# Patient Record
Sex: Female | Born: 1990
Health system: Southern US, Community
[De-identification: ages and names within clinical notes are randomized; demographics above are authoritative.]

## PROBLEM LIST (undated history)

## (undated) DIAGNOSIS — N912 Amenorrhea, unspecified: Secondary | ICD-10-CM

## (undated) HISTORY — DX: Amenorrhea, unspecified: N91.2

## (undated) HISTORY — PX: WISDOM TOOTH EXTRACTION: SHX21

---

## 2016-02-19 ENCOUNTER — Ambulatory Visit: Payer: Self-pay | Admitting: Physician Assistant

## 2016-03-04 ENCOUNTER — Encounter: Payer: Self-pay | Admitting: Physician Assistant

## 2016-03-04 ENCOUNTER — Ambulatory Visit (INDEPENDENT_AMBULATORY_CARE_PROVIDER_SITE_OTHER): Payer: PRIVATE HEALTH INSURANCE | Admitting: Physician Assistant

## 2016-03-04 VITALS — BP 108/61 | HR 71 | Ht 66.0 in | Wt 218.0 lb

## 2016-03-04 DIAGNOSIS — F900 Attention-deficit hyperactivity disorder, predominantly inattentive type: Secondary | ICD-10-CM | POA: Insufficient documentation

## 2016-03-04 MED ORDER — METHYLPHENIDATE HCL ER (OSM) 27 MG PO TBCR: 27.0000 mg | EXTENDED_RELEASE_TABLET | ORAL | 0 refills | Status: DC

## 2016-03-04 NOTE — Progress Notes (Signed)
   Subjective:    Patient ID: Crystal Bernard, female    DOB: 02/18/1991, 25 y.o.   MRN: [id number]  HPI  Pt is a 25 yo female who presents to the clinic to establish care.   She has no past medical history except for ADHD. She was diagnosed and been on medication since 2nd grade.   She is currently on Adderall IM but only once a day. She has never tried anything other than adderall. She did try XR for a few months but did not like the way it made her feel groggy, moody, no appetitie. IM seems to be out of her system faster and she feels normal at the end of the day. She wants to try something else. She takes adderall 25mg  1-2 tablets up to twice a day.   .. Family History  Problem Relation Age of Onset  . Alcohol abuse Father   . Depression Father   . Hyperlipidemia Father   . Hypertension Father         Review of Systems  All other systems reviewed and are negative.      Objective:   Physical Exam  Constitutional: She is oriented to person, place, and time. She appears well-developed and well-nourished.  HENT:  Head: Normocephalic and atraumatic.  Cardiovascular: Normal rate, regular rhythm and normal heart sounds.   Pulmonary/Chest: Effort normal and breath sounds normal.  Neurological: She is alert and oriented to person, place, and time.  Psychiatric: She has a normal mood and affect. Her behavior is normal.          Assessment & Plan:  Marland Kitchen.Marland Kitchen.Diagnoses and all orders for this visit:  Attention deficit hyperactivity disorder (ADHD), predominantly inattentive type -     methylphenidate 27 MG PO CR tablet; Take 1 tablet (27 mg total) by mouth every morning.   Will try concerta. Call in one month and let me know how doing. May consider increasing at that time.

## 2016-03-11 ENCOUNTER — Ambulatory Visit: Payer: Self-pay | Admitting: Physician Assistant

## 2016-05-03 ENCOUNTER — Encounter: Payer: Self-pay | Admitting: Sports Medicine

## 2016-05-03 ENCOUNTER — Ambulatory Visit (INDEPENDENT_AMBULATORY_CARE_PROVIDER_SITE_OTHER): Payer: 59

## 2016-05-03 ENCOUNTER — Ambulatory Visit (INDEPENDENT_AMBULATORY_CARE_PROVIDER_SITE_OTHER): Payer: 59 | Admitting: Sports Medicine

## 2016-05-03 DIAGNOSIS — W19XXXA Unspecified fall, initial encounter: Secondary | ICD-10-CM

## 2016-05-03 DIAGNOSIS — S8991XA Unspecified injury of right lower leg, initial encounter: Secondary | ICD-10-CM | POA: Insufficient documentation

## 2016-05-03 DIAGNOSIS — S83102A Unspecified subluxation of left knee, initial encounter: Secondary | ICD-10-CM

## 2016-05-03 DIAGNOSIS — S83101A Unspecified subluxation of right knee, initial encounter: Secondary | ICD-10-CM

## 2016-05-03 MED ORDER — MELOXICAM 15 MG PO TABS: ORAL_TABLET | ORAL | 3 refills | Status: DC

## 2016-05-03 NOTE — Progress Notes (Signed)
   Subjective:    I'm seeing this patient as a consultation for:  Tandy GawJade Breeback, PA-C  CC: Right knee injury  HPI: This is a pleasant 26 year old female, 2 days ago she twisted and fell on her knee, it was forced into a valgus type position. She had immediate pain, swelling that she localized along the medial joint line, she is unable to fully extend the knee, pain is moderate, persistent.  Past medical history:  Negative.  See flowsheet/record as well for more information.  Surgical history: Negative.  See flowsheet/record as well for more information.  Family history: Negative.  See flowsheet/record as well for more information.  Social history: Negative.  See flowsheet/record as well for more information.  Allergies, and medications have been entered into the medical record, reviewed, and no changes needed.   Review of Systems: No headache, visual changes, nausea, vomiting, diarrhea, constipation, dizziness, abdominal pain, skin rash, fevers, chills, night sweats, weight loss, swollen lymph nodes, body aches, joint swelling, muscle aches, chest pain, shortness of breath, mood changes, visual or auditory hallucinations.   Objective:   General: Well Developed, well nourished, and in no acute distress.  Neuro/Psych: Alert and oriented x3, extra-ocular muscles intact, able to move all 4 extremities, sensation grossly intact. Skin: Warm and dry, no rashes noted.  Respiratory: Not using accessory muscles, speaking in full sentences, trachea midline.  Cardiovascular: Pulses palpable, no extremity edema. Abdomen: Does not appear distended. Right Knee: Moderate swelling with an effusion, guarding so unable to fully appreciate the anterior cruciate ligament, she does have pain with valgus stress at the medial joint line but the MCL is intact. ROM normal in flexion and extension and lower leg rotation. Ligaments with solid consistent endpoints including ACL, PCL, LCL, MCL. Negative Mcmurray's  and provocative meniscal tests. Non painful patellar compression. Patellar and quadriceps tendons unremarkable. Hamstring and quadriceps strength is normal.  Impression and Recommendations:   This case required medical decision making of moderate complexity.  Right knee injury Rotation and a valgus stress, definite MCL sprain. I am unable to appreciate her anterior cruciate ligament on exam and she does have an effusion. X-rays, meloxicam, MRI. Return to go over MRI results.

## 2016-05-03 NOTE — Assessment & Plan Note (Signed)
Rotation and a valgus stress, definite MCL sprain. I am unable to appreciate her anterior cruciate ligament on exam and she does have an effusion. X-rays, meloxicam, MRI. Return to go over MRI results.

## 2016-05-06 ENCOUNTER — Ambulatory Visit (INDEPENDENT_AMBULATORY_CARE_PROVIDER_SITE_OTHER): Payer: 59

## 2016-05-06 ENCOUNTER — Other Ambulatory Visit: Payer: Self-pay | Admitting: Physician Assistant

## 2016-05-06 DIAGNOSIS — M6751 Plica syndrome, right knee: Secondary | ICD-10-CM | POA: Diagnosis not present

## 2016-05-06 DIAGNOSIS — S8991XA Unspecified injury of right lower leg, initial encounter: Secondary | ICD-10-CM

## 2016-05-09 ENCOUNTER — Ambulatory Visit: Payer: 59 | Admitting: Sports Medicine

## 2016-05-21 ENCOUNTER — Telehealth: Payer: Self-pay

## 2016-05-21 MED ORDER — OSELTAMIVIR PHOSPHATE 75 MG PO CAPS: 75.0000 mg | ORAL_CAPSULE | Freq: Two times a day (BID) | ORAL | 0 refills | Status: DC

## 2016-05-21 NOTE — Telephone Encounter (Signed)
Crystal Bernard complains of watery eyes, headaches, cold chills, fever and her skin hurts. She states she believes she has the flu. Would like tamiflu. Please advise.

## 2016-05-21 NOTE — Telephone Encounter (Signed)
Ok to send tamiflu 75mg  1 po bid for 5 days #10

## 2016-05-21 NOTE — Telephone Encounter (Signed)
Medication sent. Patient is aware.  

## 2016-06-18 ENCOUNTER — Telehealth: Payer: Self-pay | Admitting: Physician Assistant

## 2016-06-18 MED ORDER — OSELTAMIVIR PHOSPHATE 75 MG PO CAPS: 75.0000 mg | ORAL_CAPSULE | Freq: Every day | ORAL | 0 refills | Status: DC

## 2016-06-18 NOTE — Telephone Encounter (Signed)
Pt's boyfriend has flu dx. She would like preventative. Sent to pharmacy.

## 2016-10-07 ENCOUNTER — Ambulatory Visit: Payer: 59 | Admitting: Osteopathic Medicine

## 2016-10-17 ENCOUNTER — Encounter: Payer: Self-pay | Admitting: Osteopathic Medicine

## 2016-10-17 ENCOUNTER — Ambulatory Visit (INDEPENDENT_AMBULATORY_CARE_PROVIDER_SITE_OTHER): Payer: 59 | Admitting: Osteopathic Medicine

## 2016-10-17 ENCOUNTER — Telehealth: Payer: Self-pay | Admitting: Osteopathic Medicine

## 2016-10-17 VITALS — BP 111/60 | HR 79 | Ht 65.0 in | Wt 216.4 lb

## 2016-10-17 DIAGNOSIS — Z30015 Encounter for initial prescription of vaginal ring hormonal contraceptive: Secondary | ICD-10-CM

## 2016-10-17 MED ORDER — ETONOGESTREL-ETHINYL ESTRADIOL 0.12-0.015 MG/24HR VA RING: VAGINAL_RING | VAGINAL | 4 refills | Status: DC

## 2016-10-17 NOTE — Telephone Encounter (Signed)
Pt informed. Pt had understanding and is agreeable. Pt just wants to discuss birth control options today.

## 2016-10-17 NOTE — Telephone Encounter (Signed)
Noted. Thanks.

## 2016-10-17 NOTE — Telephone Encounter (Signed)
Patient is on my schedule today for IUD insertion. I have never seen her before. Please call patient:  Does she know what kind of IUD she wants? (We need to make sure that we have the desired one in the office - or if patient isn't sure which one she wants, we can discuss it at the visit today).   It is also standard practice to confirm a negative gonorrhea and chlamydia test prior to IUD insertion. I have no record that she has been recently tested, but we can do that today at her visit. Today's appointment can serve as the consultation for IUD insertion but I do not know if we will be able to insert one today since we will not have confirmed negative STD testing or the desired IUD in stock. Please call and make sure the patient is aware of the above.

## 2016-10-17 NOTE — Patient Instructions (Signed)
Ethinyl Estradiol; Etonogestrel vaginal ring What is this medicine? ETHINYL ESTRADIOL; ETONOGESTREL (ETH in il es tra DYE ole; et oh noe JES trel) vaginal ring is a flexible, vaginal ring used as a contraceptive (birth control method). This medicine combines two types of female hormones, an estrogen and a progestin. This ring is used to prevent ovulation and pregnancy. Each ring is effective for one month. This medicine may be used for other purposes; ask your health care provider or pharmacist if you have questions. COMMON BRAND NAME(S): NuvaRing What should I tell my health care provider before I take this medicine? They need to know if you have or ever had any of these conditions: -abnormal vaginal bleeding -blood vessel disease or blood clots -breast, cervical, endometrial, ovarian, liver, or uterine cancer -diabetes -gallbladder disease -heart disease or recent heart attack -high blood pressure -high cholesterol -kidney disease -liver disease -migraine headaches -stroke -systemic lupus erythematosus (SLE) -tobacco smoker -an unusual or allergic reaction to estrogens, progestins, other medicines, foods, dyes, or preservatives -pregnant or trying to get pregnant -breast-feeding How should I use this medicine? Insert the ring into your vagina as directed. Follow the directions on the prescription label. The ring will remain place for 3 weeks and is then removed for a 1-week break. A new ring is inserted 1 week after the last ring was removed, on the same day of the week. Check often to make sure the ring is still in place, especially before and after sexual intercourse. If the ring was out of the vagina for an unknown amount of time, you may not be protected from pregnancy. Perform a pregnancy test and call your doctor. Do not use more often than directed. A patient package insert for the product will be given with each prescription and refill. Read this sheet carefully each time. The  sheet may change frequently. Contact your pediatrician regarding the use of this medicine in children. Special care may be needed. This medicine has been used in female children who have started having menstrual periods. Overdosage: If you think you have taken too much of this medicine contact a poison control center or emergency room at once. NOTE: This medicine is only for you. Do not share this medicine with others. What if I miss a dose? You will need to replace your vaginal ring once a month as directed. If the ring should slip out, or if you leave it in longer or shorter than you should, contact your health care professional for advice. What may interact with this medicine? Do not take this medicine with the following medication: -dasabuvir; ombitasvir; paritaprevir; ritonavir -ombitasvir; paritaprevir; ritonavir This medicine may also interact with the following medications: -acetaminophen -antibiotics or medicines for infections, especially rifampin, rifabutin, rifapentine, and griseofulvin, and possibly penicillins or tetracyclines -aprepitant -ascorbic acid (vitamin C) -atorvastatin -barbiturate medicines, such as phenobarbital -bosentan -carbamazepine -caffeine -clofibrate -cyclosporine -dantrolene -doxercalciferol -felbamate -grapefruit juice -hydrocortisone -medicines for anxiety or sleeping problems, such as diazepam or temazepam -medicines for diabetes, including pioglitazone -modafinil -mycophenolate -nefazodone -oxcarbazepine -phenytoin -prednisolone -ritonavir or other medicines for HIV infection or AIDS -rosuvastatin -selegiline -soy isoflavones supplements -St. John's wort -tamoxifen or raloxifene -theophylline -thyroid hormones -topiramate -warfarin This list may not describe all possible interactions. Give your health care provider a list of all the medicines, herbs, non-prescription drugs, or dietary supplements you use. Also tell them if you smoke,  drink alcohol, or use illegal drugs. Some items may interact with your medicine. What should I watch for while using   this medicine? Visit your doctor or health care professional for regular checks on your progress. You will need a regular breast and pelvic exam and Pap smear while on this medicine. Use an additional method of contraception during the first cycle that you use this ring. Do not use a diaphragm or female condom, as the ring can interfere with these birth control methods and their proper placement. If you have any reason to think you are pregnant, stop using this medicine right away and contact your doctor or health care professional. If you are using this medicine for hormone related problems, it may take several cycles of use to see improvement in your condition. Smoking increases the risk of getting a blood clot or having a stroke while you are using hormonal birth control, especially if you are more than 24 years old. You are strongly advised not to smoke. This medicine can make your body retain fluid, making your fingers, hands, or ankles swell. Your blood pressure can go up. Contact your doctor or health care professional if you feel you are retaining fluid. This medicine can make you more sensitive to the sun. Keep out of the sun. If you cannot avoid being in the sun, wear protective clothing and use sunscreen. Do not use sun lamps or tanning beds/booths. If you wear contact lenses and notice visual changes, or if the lenses begin to feel uncomfortable, consult your eye care specialist. In some women, tenderness, swelling, or minor bleeding of the gums may occur. Notify your dentist if this happens. Brushing and flossing your teeth regularly may help limit this. See your dentist regularly and inform your dentist of the medicines you are taking. If you are going to have elective surgery, you may need to stop using this medicine before the surgery. Consult your health care professional  for advice. This medicine does not protect you against HIV infection (AIDS) or any other sexually transmitted diseases. What side effects may I notice from receiving this medicine? Side effects that you should report to your doctor or health care professional as soon as possible: -breast tissue changes or discharge -changes in vaginal bleeding during your period or between your periods -chest pain -coughing up blood -dizziness or fainting spells -headaches or migraines -leg, arm or groin pain -severe or sudden headaches -stomach pain (severe) -sudden shortness of breath -sudden loss of coordination, especially on one side of the body -speech problems -symptoms of vaginal infection like itching, irritation or unusual discharge -tenderness in the upper abdomen -vomiting -weakness or numbness in the arms or legs, especially on one side of the body -yellowing of the eyes or skin Side effects that usually do not require medical attention (report to your doctor or health care professional if they continue or are bothersome): -breakthrough bleeding and spotting that continues beyond the 3 initial cycles of pills -breast tenderness -mood changes, anxiety, depression, frustration, anger, or emotional outbursts -increased sensitivity to sun or ultraviolet light -nausea -skin rash, acne, or brown spots on the skin -weight gain (slight) This list may not describe all possible side effects. Call your doctor for medical advice about side effects. You may report side effects to FDA at 1-800-FDA-1088. Where should I keep my medicine? Keep out of the reach of children. Store at room temperature between 15 and 30 degrees C (59 and 86 degrees F) for up to 4 months. The product will expire after 4 months. Protect from light. Throw away any unused medicine after the expiration date. NOTE: This   sheet is a summary. It may not cover all possible information. If you have questions about this medicine, talk  to your doctor, pharmacist, or health care provider.  2018 Elsevier/Gold Standard (2015-12-08 17:00:31)  

## 2016-10-18 NOTE — Progress Notes (Signed)
HPI: Crystal Bernard is a 26 y.o. female  who [facility], 10/18/16,  for chief complaint of:  Chief Complaint  Patient presents with  . Contraception    Patient is a virgin, will be getting married in the fall and would like to discuss contraception options. She has some thoughts about IUD, would like to know more about choices here. Has been on birth control pill in the past but these caused weight gain issues. Questionable history of PCOS, patient states ultrasound was normal, menstrual cycle is regular.  Patient is open to long-acting reversible contraception but has some concerns about IUD insertion. Would like to avoid the upper shot or nexplanon due to some concerns for weight gain issues.  Patient is accompanied by mom who assists with history-taking.   Past medical history, surgical history, social history and family history reviewed.  Patient Active Problem List   Diagnosis Date Noted  . Right knee injury 05/03/2016  . Attention deficit hyperactivity disorder (ADHD), predominantly inattentive type 03/04/2016    Current medication list and allergy/intolerance information reviewed.   No current outpatient prescriptions on file prior to visit.   No current facility-administered medications on file prior to visit.    Allergies  Allergen Reactions  . Eggs Or Egg-Derived Products Other (See Comments)    Unknown reaction      Review of Systems:  Constitutional: No recent illness, feels well today   HEENT: No  headache, no hx migraine  Cardiac: No  chest pain, No palpitations  Neurologic: No  weakness, No  Dizziness  Psychiatric: No  concerns with depression, No  concerns with anxiety  Exam:  BP 111/60   Pulse 79   Ht 5\' 5"  (1.651 m)   Wt 216 lb 6.4 oz (98.2 kg)   SpO2 99%   BMI 36.01 kg/m   Constitutional: VS see above. General Appearance: alert, well-developed, well-nourished, NAD  Psychiatric:  Normal judgment/insight. Normal mood and affect. Oriented x3.      ASSESSMENT/PLAN: After discussion of options, has decided to try NuvaRing since this is a good middle of the road option between daily pill versus implantable device/long-acting reversible contraception. Side effects and ideal use of medication were reviewed with the patient. Advised can leave in place maximal 4 weeks.  Encounter for initial prescription of vaginal ring hormonal contraceptive   Follow-up plan: Return for annual exam / pap when due, sooner if needed.  Visit summary with medication list and pertinent instructions was printed for patient to review, alert us if any changes needed. All questions at time of visit were answered - patient instructed to contact office with any additional concerns. ER/RTC precautions were reviewed with the patient and understanding verbalized.   Note: Total time spent 25 minutes, greater than 50% of the visit was spent face-to-face counseling and coordinating care for the following: The encounter diagnosis was Encounter for initial prescription of vaginal ring hormonal contraceptive..Marland Kitchen

## 2016-12-11 ENCOUNTER — Telehealth: Payer: Self-pay | Admitting: Physician Assistant

## 2016-12-11 MED ORDER — SCOPOLAMINE 1 MG/3DAYS TD PT72: 1.0000 | MEDICATED_PATCH | TRANSDERMAL | 0 refills | Status: DC

## 2016-12-11 NOTE — Telephone Encounter (Signed)
Pt is going on her honeymoon and need motion sickness patches.

## 2017-03-19 ENCOUNTER — Ambulatory Visit: Payer: 59 | Admitting: Physician Assistant

## 2017-03-19 ENCOUNTER — Encounter: Payer: Self-pay | Admitting: Physician Assistant

## 2017-03-19 VITALS — BP 144/65 | HR 95 | Temp 98.8°F | Ht 65.0 in | Wt 221.0 lb

## 2017-03-19 DIAGNOSIS — E6609 Other obesity due to excess calories: Secondary | ICD-10-CM | POA: Diagnosis not present

## 2017-03-19 DIAGNOSIS — R52 Pain, unspecified: Secondary | ICD-10-CM

## 2017-03-19 DIAGNOSIS — J069 Acute upper respiratory infection, unspecified: Secondary | ICD-10-CM | POA: Diagnosis not present

## 2017-03-19 DIAGNOSIS — Z6836 Body mass index (BMI) 36.0-36.9, adult: Secondary | ICD-10-CM

## 2017-03-19 DIAGNOSIS — J029 Acute pharyngitis, unspecified: Secondary | ICD-10-CM

## 2017-03-19 DIAGNOSIS — J0120 Acute ethmoidal sinusitis, unspecified: Secondary | ICD-10-CM | POA: Diagnosis not present

## 2017-03-19 DIAGNOSIS — R635 Abnormal weight gain: Secondary | ICD-10-CM | POA: Diagnosis not present

## 2017-03-19 LAB — POCT INFLUENZA A/B
INFLUENZA A, POC: NEGATIVE
INFLUENZA B, POC: NEGATIVE

## 2017-03-19 LAB — POCT RAPID STREP A (OFFICE): Rapid Strep A Screen: NEGATIVE

## 2017-03-19 MED ORDER — AMOXICILLIN-POT CLAVULANATE 875-125 MG PO TABS: 1.0000 | ORAL_TABLET | Freq: Two times a day (BID) | ORAL | 0 refills | Status: DC

## 2017-03-19 MED ORDER — NORETHIN ACE-ETH ESTRAD-FE 1-20 MG-MCG PO TABS: 1.0000 | ORAL_TABLET | Freq: Every day | ORAL | 11 refills | Status: DC

## 2017-03-19 NOTE — Patient Instructions (Signed)
Upper Respiratory Infection, Adult Most upper respiratory infections (URIs) are caused by a virus. A URI affects the nose, throat, and upper air passages. The most common type of URI is often called "the common cold." Follow these instructions at home:  Take medicines only as told by your doctor.  Gargle warm saltwater or take cough drops to comfort your throat as told by your doctor.  Use a warm mist humidifier or inhale steam from a shower to increase air moisture. This may make it easier to breathe.  Drink enough fluid to keep your pee (urine) clear or pale yellow.  Eat soups and other clear broths.  Have a healthy diet.  Rest as needed.  Go back to work when your fever is gone or your doctor says it is okay. ? You may need to stay home longer to avoid giving your URI to others. ? You can also wear a face mask and wash your hands often to prevent spread of the virus.  Use your inhaler more if you have asthma.  Do not use any tobacco products, including cigarettes, chewing tobacco, or electronic cigarettes. If you need help quitting, ask your doctor. Contact a doctor if:  You are getting worse, not better.  Your symptoms are not helped by medicine.  You have chills.  You are getting more short of breath.  You have brown or red mucus.  You have yellow or brown discharge from your nose.  You have pain in your face, especially when you bend forward.  You have a fever.  You have puffy (swollen) neck glands.  You have pain while swallowing.  You have white areas in the back of your throat. Get help right away if:  You have very bad or constant: ? Headache. ? Ear pain. ? Pain in your forehead, behind your eyes, and over your cheekbones (sinus pain). ? Chest pain.  You have long-lasting (chronic) lung disease and any of the following: ? Wheezing. ? Long-lasting cough. ? Coughing up blood. ? A change in your usual mucus.  You have a stiff neck.  You have  changes in your: ? Vision. ? Hearing. ? Thinking. ? Mood. This information is not intended to replace advice given to you by your health care provider. Make sure you discuss any questions you have with your health care provider. Document Released: 09/18/2007 Document Revised: 12/03/2015 Document Reviewed: 07/07/2013 Elsevier Interactive Patient Education  2018 Elsevier Inc.  

## 2017-03-20 ENCOUNTER — Encounter: Payer: Self-pay | Admitting: Physician Assistant

## 2017-03-20 NOTE — Progress Notes (Signed)
Subjective:    Patient ID: Crystal Bernard, female    DOB: 12/21/1990, 26 y.o.   MRN: [id number]  HPI Patient is a 26 year old female who presents to the clinic with sore throat, body aches, chills for the last 3 days.  She is concerned because she has had multiple flu and strep exposures.  She is concerned for strep throat because she always gets a specific smell when she has strep throat.  She has that smell now.  She has not checked her temperature but she reports feeling hot.  She denies any vomiting, nausea or diarrhea.  She does have an ongoing headache.  She has not tried any medications over-the-counter.  Patient also mentions that she feels like her birth control is making her gain weight.  She feels like it is harder to keep the weight off.  She would like to switch birth controls.  She admits she is not exercising or dieting.  .. Active Ambulatory Problems    Diagnosis Date Noted  . Attention deficit hyperactivity disorder (ADHD), predominantly inattentive type 03/04/2016  . Right knee injury 05/03/2016   Resolved Ambulatory Problems    Diagnosis Date Noted  . No Resolved Ambulatory Problems   No Additional Past Medical History      Review of Systems  All other systems reviewed and are negative.      Objective:   Physical Exam  Constitutional: She is oriented to person, place, and time. She appears well-developed and well-nourished.  Pale complexion.   HENT:  Head: Normocephalic and atraumatic.  Right Ear: External ear normal.  Left Ear: External ear normal.  Nose: Nose normal.  TM's clear.  Negative for sinus tenderness to palpation.  Oropharynx erythematous without tonsil swelling or exudate.   Eyes: Conjunctivae are normal. Right eye exhibits no discharge. Left eye exhibits no discharge.  Neck: Normal range of motion. Neck supple.  Cardiovascular: Normal rate, regular rhythm and normal heart sounds.  Pulmonary/Chest: Effort normal and breath sounds  normal. She has no wheezes.  Lymphadenopathy:    She has no cervical adenopathy.  Neurological: She is alert and oriented to person, place, and time.  Skin: Skin is warm.  Psychiatric: She has a normal mood and affect. Her behavior is normal.          Assessment & Plan:  Marland Kitchen.Marland Kitchen.[name] was seen today for cough, headache and sore throat.  Diagnoses and all orders for this visit:  Acute upper respiratory infection -     amoxicillin-clavulanate (AUGMENTIN) 875-125 MG tablet; Take 1 tablet by mouth 2 (two) times daily.  Abnormal weight gain -     norethindrone-ethinyl estradiol (JUNEL FE 1/20) 1-20 MG-MCG tablet; Take 1 tablet by mouth daily. -     TSH  Body aches -     POCT Influenza A/B  Sore throat -     POCT rapid strep A -     POCT Influenza A/B  Acute non-recurrent ethmoidal sinusitis -     amoxicillin-clavulanate (AUGMENTIN) 875-125 MG tablet; Take 1 tablet by mouth 2 (two) times daily.   .. Results for orders placed or performed in visit on 03/19/17  POCT rapid strep A  Result Value Ref Range   Rapid Strep A Screen Negative Negative  POCT Influenza A/B  Result Value Ref Range   Influenza A, POC Negative Negative   Influenza B, POC Negative Negative   Flu and strep were both negative.  I suspect this is a viral infection.  I discussed extensively  symptomatic control of symptoms with Mucinex, decongestants, nasal sprays, rest and hydration.  She has started to have a headache with some sinusitis symptoms.  Antibiotic printed if she has symptoms for longer than a week she could start antibiotic.  Follow-up as needed.  Discussed weight with patient.  Encouraged counting calories with goal being 1200-1500 cal a day.  Increasing protein and decreasing carbohydrates.  Certainly we can try birth control switch to see if this would help with symptoms.  Stop NuvaRing start she now.  I did choose a low estrogen component.  Follow-up in 3 months to see if this is helping.  We  could certainly talk about weight loss medicines as well.  Encouraged at least 150 minutes of exercise a week.

## 2017-03-21 DIAGNOSIS — E6609 Other obesity due to excess calories: Secondary | ICD-10-CM | POA: Insufficient documentation

## 2017-03-21 DIAGNOSIS — R635 Abnormal weight gain: Secondary | ICD-10-CM | POA: Insufficient documentation

## 2017-09-12 ENCOUNTER — Encounter: Payer: Self-pay | Admitting: Physician Assistant

## 2017-09-12 ENCOUNTER — Ambulatory Visit: Payer: 59 | Admitting: Physician Assistant

## 2017-09-12 VITALS — BP 110/65 | HR 79 | Ht 65.0 in | Wt 219.0 lb

## 2017-09-12 DIAGNOSIS — N649 Disorder of breast, unspecified: Secondary | ICD-10-CM | POA: Diagnosis not present

## 2017-09-12 DIAGNOSIS — Z3009 Encounter for other general counseling and advice on contraception: Secondary | ICD-10-CM | POA: Diagnosis not present

## 2017-09-12 DIAGNOSIS — L988 Other specified disorders of the skin and subcutaneous tissue: Secondary | ICD-10-CM

## 2017-09-12 DIAGNOSIS — F900 Attention-deficit hyperactivity disorder, predominantly inattentive type: Secondary | ICD-10-CM

## 2017-09-12 MED ORDER — AMPHETAMINE-DEXTROAMPHET ER 20 MG PO CP24
20.0000 mg | ORAL_CAPSULE | ORAL | 0 refills | Status: DC
Start: 2017-09-12 — End: 2017-11-19

## 2017-09-12 MED ORDER — NORETHIN-ETH ESTRAD-FE BIPHAS 1 MG-10 MCG / 10 MCG PO TABS: 1.0000 | ORAL_TABLET | Freq: Every day | ORAL | 11 refills | Status: DC

## 2017-09-12 NOTE — Progress Notes (Signed)
   Subjective:    Patient ID: Crystal Bernard, female    DOB: 1991/03/29, 27 y.o.   MRN: [id number]  HPI   Pt is a pleasant 27 yo female who presents to the clinic for a few concerns.   She was on concerta  and did not do the trick. She would like to try adderall XR again. She started a new job and felt like that did better in the past.   She also wants a switch of OCP.   She c/o red spot on left breat. Not tried anything to make better. No pain or itching. It does seem to be getting better. No fam hx of BC. No injury or trauma.   .. Active Ambulatory Problems    Diagnosis Date Noted  . Attention deficit hyperactivity disorder (ADHD), predominantly inattentive type 03/04/2016  . Right knee injury 05/03/2016  . Class 2 obesity due to excess calories without serious comorbidity with body mass index (BMI) of 36.0 to 36.9 in adult 03/21/2017  . Abnormal weight gain 03/21/2017  . Skin lesion of breast 09/14/2017   Resolved Ambulatory Problems    Diagnosis Date Noted  . No Resolved Ambulatory Problems   No Additional Past Medical History     Review of Systems  All other systems reviewed and are negative.      Objective:   Physical Exam  Constitutional: She appears well-developed and well-nourished.  HENT:  Head: Normocephalic and atraumatic.  Cardiovascular: Normal rate and regular rhythm.  Pulmonary/Chest: Effort normal and breath sounds normal.    Psychiatric: She has a normal mood and affect. Her behavior is normal.          Assessment & Plan:  Marland KitchenMarland KitchenDiagnoses and all orders for this visit:  Birth control counseling -     Norethindrone-Ethinyl Estradiol-Fe Biphas (LO LOESTRIN FE) 1 MG-10 MCG / 10 MCG tablet; Take 1 tablet by mouth daily.  Skin lesion of breast  Attention deficit hyperactivity disorder (ADHD), predominantly inattentive type -     amphetamine-dextroamphetamine (ADDERALL XR) 20 MG 24 hr capsule; Take 1 capsule (20 mg total) by mouth every  morning.  will switch to lower estrogen dose. Discussed coupon card. Let me know in 2 cycles if you like it.   Adderall given to try for next month. Let me know how the switch is.   Lesion on left breast. Appears to bug bite. Hydrocortisone cool compresses and good moisturizer. Follow up if not improving or resolving.

## 2017-09-14 ENCOUNTER — Encounter: Payer: Self-pay | Admitting: Physician Assistant

## 2017-09-14 DIAGNOSIS — N649 Disorder of breast, unspecified: Secondary | ICD-10-CM | POA: Insufficient documentation

## 2017-09-14 DIAGNOSIS — L988 Other specified disorders of the skin and subcutaneous tissue: Secondary | ICD-10-CM | POA: Insufficient documentation

## 2017-10-05 ENCOUNTER — Telehealth: Payer: Self-pay | Admitting: Physician Assistant

## 2017-10-05 DIAGNOSIS — N926 Irregular menstruation, unspecified: Secondary | ICD-10-CM

## 2017-10-05 NOTE — Telephone Encounter (Signed)
Pt called office with missed period for 1 month. Negative home test. She does have nipple soreness, fatigue and wonders if she could be pregnant.

## 2017-10-06 ENCOUNTER — Ambulatory Visit: Payer: 59 | Admitting: Physician Assistant

## 2017-10-06 LAB — BETA HCG QUANT (REF LAB): hCG Quant: <1 m[IU]/mL

## 2017-11-10 ENCOUNTER — Encounter: Payer: 59 | Admitting: Physician Assistant

## 2017-11-10 ENCOUNTER — Telehealth: Payer: Self-pay | Admitting: *Deleted

## 2017-11-10 ENCOUNTER — Other Ambulatory Visit: Payer: Self-pay | Admitting: Physician Assistant

## 2017-11-10 DIAGNOSIS — Z0000 Encounter for general adult medical examination without abnormal findings: Secondary | ICD-10-CM

## 2017-11-10 DIAGNOSIS — Z131 Encounter for screening for diabetes mellitus: Secondary | ICD-10-CM

## 2017-11-10 DIAGNOSIS — N911 Secondary amenorrhea: Secondary | ICD-10-CM

## 2017-11-10 DIAGNOSIS — Z13220 Encounter for screening for lipoid disorders: Secondary | ICD-10-CM

## 2017-11-10 NOTE — Telephone Encounter (Signed)
Labs reordered all on one lab slip.

## 2017-11-12 ENCOUNTER — Ambulatory Visit (INDEPENDENT_AMBULATORY_CARE_PROVIDER_SITE_OTHER): Payer: Managed Care, Other (non HMO)

## 2017-11-12 ENCOUNTER — Ambulatory Visit (INDEPENDENT_AMBULATORY_CARE_PROVIDER_SITE_OTHER): Payer: Managed Care, Other (non HMO) | Admitting: Physician Assistant

## 2017-11-12 ENCOUNTER — Encounter: Payer: Self-pay | Admitting: Physician Assistant

## 2017-11-12 VITALS — BP 128/69 | HR 98 | Ht 65.0 in | Wt 219.0 lb

## 2017-11-12 DIAGNOSIS — F900 Attention-deficit hyperactivity disorder, predominantly inattentive type: Secondary | ICD-10-CM | POA: Diagnosis not present

## 2017-11-12 DIAGNOSIS — N911 Secondary amenorrhea: Secondary | ICD-10-CM

## 2017-11-12 DIAGNOSIS — R7989 Other specified abnormal findings of blood chemistry: Secondary | ICD-10-CM

## 2017-11-12 DIAGNOSIS — N912 Amenorrhea, unspecified: Secondary | ICD-10-CM

## 2017-11-12 DIAGNOSIS — Z01419 Encounter for gynecological examination (general) (routine) without abnormal findings: Secondary | ICD-10-CM

## 2017-11-12 NOTE — Progress Notes (Signed)
Subjective:     Crystal Bernard is a 27 y.o. female and is here for a comprehensive physical exam. The patient reports problems - pt stopped OCP in june and has not had a period since. she is wanting pregnancy in the near future. she has always thought she could not have children and now because she wants to have children she is worried she cannot. a long time ago she was told she had a endometral cyst. she has painfulf periods. .  Needs ADHD follow up and refill. Doing well. No increase in anxiety, palpitations, vision changes.   Social History   Socioeconomic History  . Marital status: Single    Spouse name: Not on file  . Number of children: Not on file  . Years of education: Not on file  . Highest education level: Not on file  Occupational History  . Not on file  Social Needs  . Financial resource strain: Not on file  . Food insecurity:    Worry: Not on file    Inability: Not on file  . Transportation needs:    Medical: Not on file    Non-medical: Not on file  Tobacco Use  . Smoking status: Never Smoker  . Smokeless tobacco: Never Used  Substance and Sexual Activity  . Alcohol use: Yes  . Drug use: No  . Sexual activity: Never  Lifestyle  . Physical activity:    Days per week: Not on file    Minutes per session: Not on file  . Stress: Not on file  Relationships  . Social connections:    Talks on phone: Not on file    Gets together: Not on file    Attends religious service: Not on file    Active member of club or organization: Not on file    Attends meetings of clubs or organizations: Not on file    Relationship status: Not on file  . Intimate partner violence:    Fear of current or ex partner: Not on file    Emotionally abused: Not on file    Physically abused: Not on file    Forced sexual activity: Not on file  Other Topics Concern  . Not on file  Social History Narrative  . Not on file   Health Maintenance  Topic Date Due  . HIV Screening  01/17/2008   . PAP SMEAR  01/16/2014  . TETANUS/TDAP  09/13/2018 (Originally 01/17/2012)  . INFLUENZA VACCINE  11/13/2017    The following portions of the patient's history were reviewed and updated as appropriate: allergies, current medications, past family history, past medical history, past social history, past surgical history and problem list.  Review of Systems Pertinent items noted in HPI and remainder of comprehensive ROS otherwise negative.   Objective:    BP 128/69   Pulse 98   Ht 5\' 5"  (1.651 m)   Wt 219 lb (99.3 kg)   LMP 08/10/2017   BMI 36.44 kg/m  General appearance: alert, cooperative, appears stated age and moderately obese Head: Normocephalic, without obvious abnormality, atraumatic Eyes: conjunctivae/corneas clear. PERRL, EOM's intact. Fundi benign. Ears: normal TM's and external ear canals both ears Nose: Nares normal. Septum midline. Mucosa normal. No drainage or sinus tenderness. Throat: lips, mucosa, and tongue normal; teeth and gums normal Neck: no adenopathy, no carotid bruit, no JVD, supple, symmetrical, trachea midline and thyroid not enlarged, symmetric, no tenderness/mass/nodules Back: symmetric, no curvature. ROM normal. No CVA tenderness. Lungs: clear to auscultation bilaterally Breasts: normal appearance,  no masses or tenderness Heart: regular rate and rhythm, S1, S2 normal, no murmur, click, rub or gallop Abdomen: soft, non-tender; bowel sounds normal; no masses,  no organomegaly Extremities: extremities normal, atraumatic, no cyanosis or edema Pulses: 2+ and symmetric Skin: Skin color, texture, turgor normal. No rashes or lesions Lymph nodes: Cervical, supraclavicular, and axillary nodes normal. Neurologic: Alert and oriented X 3, normal strength and tone. Normal symmetric reflexes. Normal coordination and gait    Assessment:    Healthy female exam.      Plan:    Marland KitchenMarland KitchenShelby-Anne was seen today for annual exam and gynecologic exam.  Diagnoses and all  orders for this visit:  Encounter for well woman exam with routine gynecological exam -     Cytology - PAP  Secondary amenorrhea -     US PELVIS (TRANSABDOMINAL ONLY) -     US PELVIS TRANSVANGINAL NON-OB (TV ONLY) -     Ambulatory referral to Obstetrics / Gynecology  Low serum follicle stimulating hormone (FSH) -     US PELVIS (TRANSABDOMINAL ONLY) -     US PELVIS TRANSVANGINAL NON-OB (TV ONLY)  Attention deficit hyperactivity disorder (ADHD), predominantly inattentive type -     amphetamine-dextroamphetamine (ADDERALL XR) 20 MG 24 hr capsule; Take 1 capsule (20 mg total) by mouth every morning.   .. Depression screen Crystal Bernard 2/9 09/12/2017  Decreased Interest 0  Down, Depressed, Hopeless 0  PHQ - 2 Score 0   .. Discussed 150 minutes of exercise a week.  Encouraged vitamin D 1000 units and Calcium 1300mg  or 4 servings of dairy a day.  Concern for PCOS. Labs ordered. FSH low everything else looks great. Discussed hormones still could be regulating since coming off OCP. Encouraged to give some more time.  Ordered US pelvis/transvaginal.  Needs GYN made referral. Declined pap today. (not prepared) Declined HPV vaccine today.  adderall refilled. Follow up in 3 months.    See After Visit Summary for Counseling Recommendations

## 2017-11-12 NOTE — Patient Instructions (Signed)
Polycystic Ovarian Syndrome Polycystic ovarian syndrome (PCOS) is a common hormonal disorder among women of reproductive age. In most women with PCOS, many small fluid-filled sacs (cysts) grow on the ovaries, and the cysts are not part of a normal menstrual cycle. PCOS can cause problems with your menstrual periods and make it difficult to get pregnant. It can also cause an increased risk of miscarriage with pregnancy. If it is not treated, PCOS can lead to serious health problems, such as diabetes and heart disease. What are the causes? The cause of PCOS is not known, but it may be the result of a combination of certain factors, such as:  Irregular menstrual cycle.  High levels of certain hormones (androgens).  Problems with the hormone that helps to control blood sugar (insulin resistance).  Certain genes.  What increases the risk? This condition is more likely to develop in women who have a family history of PCOS. What are the signs or symptoms? Symptoms of PCOS may include:  Multiple ovarian cysts.  Infrequent periods or no periods.  Periods that are too frequent or too heavy.  Unpredictable periods.  Inability to get pregnant (infertility) because of not ovulating.  Increased growth of hair on the face, chest, stomach, back, thumbs, thighs, or toes.  Acne or oily skin. Acne may develop during adulthood, and it may not respond to treatment.  Pelvic pain.  Weight gain or obesity.  Patches of thickened and dark brown or black skin on the neck, arms, breasts, or thighs (acanthosis nigricans).  Excess hair growth on the face, chest, abdomen, or upper thighs (hirsutism).  How is this diagnosed? This condition is diagnosed based on:  Your medical history.  A physical exam, including a pelvic exam. Your health care provider may look for areas of increased hair growth on your skin.  Tests, such as: ? Ultrasound. This may be used to examine the ovaries and the lining of the  uterus (endometrium) for cysts. ? Blood tests. These may be used to check levels of sugar (glucose), female hormone (testosterone), and female hormones (estrogen and progesterone) in your blood.  How is this treated? There is no cure for PCOS, but treatment can help to manage symptoms and prevent more health problems from developing. Treatment varies depending on:  Your symptoms.  Whether you want to have a baby or whether you need birth control (contraception).  Treatment may include nutrition and lifestyle changes along with:  Progesterone hormone to start a menstrual period.  Birth control pills to help you have regular menstrual periods.  Medicines to make you ovulate, if you want to get pregnant.  Medicine to reduce excessive hair growth.  Surgery, in severe cases. This may involve making small holes in one or both of your ovaries. This decreases the amount of testosterone that your body produces.  Follow these instructions at home:  Take over-the-counter and prescription medicines only as told by your health care provider.  Follow a healthy meal plan. This can help you reduce the effects of PCOS. ? Eat a healthy diet that includes lean proteins, complex carbohydrates, fresh fruits and vegetables, low-fat dairy products, and healthy fats. Make sure to eat enough fiber.  If you are overweight, lose weight as told by your health care provider. ? Losing 10% of your body weight may improve symptoms. ? Your health care provider can determine how much weight loss is best for you and can help you lose weight safely.  Keep all follow-up visits as told by   your health care provider. This is important. Contact a health care provider if:  Your symptoms do not get better with medicine.  You develop new symptoms. This information is not intended to replace advice given to you by your health care provider. Make sure you discuss any questions you have with your health care  provider. Document Released: 07/26/2004 Document Revised: 11/28/2015 Document Reviewed: 09/17/2015 Elsevier Interactive Patient Education  2018 Elsevier Inc.  Diet for Polycystic Ovarian Syndrome Polycystic ovary syndrome (PCOS) is a disorder of the chemical messengers (hormones) that regulate menstruation. The condition causes important hormones to be out of balance. PCOS can:  Make your periods irregular or stop.  Cause cysts to develop on the ovaries.  Make it difficult to get pregnant.  Stop your body from responding to the effects of insulin (insulin resistance), which can lead to obesity and diabetes.  Changing what you eat can help manage PCOS and improve your health. It can help you lose weight and improve the way your body uses insulin. What is my plan?  Eat breakfast, lunch, and dinner plus two snacks every day.  Include protein in each meal and snack.  Choose whole grains instead of products made with refined flour.  Eat a variety of foods.  Exercise regularly as told by your health care provider. What do I need to know about this eating plan? If you are overweight or obese, pay attention to how many calories you eat. Cutting down on calories can help you lose weight. Work with your health care provider or dietitian to figure out how many calories you need each day. What foods can I eat? Grains Whole grains, such as whole wheat. Whole-grain breads, crackers, cereals, and pasta. Unsweetened oatmeal, bulgur, barley, quinoa, or brown rice. Corn or whole-wheat flour tortillas. Vegetables  Lettuce. Spinach. Peas. Beets. Cauliflower. Cabbage. Broccoli. Carrots. Tomatoes. Squash. Eggplant. Herbs. Peppers. Onions. Cucumbers. Brussels sprouts. Fruits Berries. Bananas. Apples. Oranges. Grapes. Papaya. Mango. Pomegranate. Kiwi. Grapefruit. Cherries. Meats and Other Protein Sources Lean proteins, such as fish, chicken, beans, eggs, and tofu. Dairy Low-fat dairy products, such  as skim milk, cheese sticks, and yogurt. Beverages Low-fat or fat-free drinks, such as water, low-fat milk, sugar-free drinks, and 100% fruit juice. Condiments Ketchup. Mustard. Barbecue sauce. Relish. Low-fat or fat-free mayonnaise. Fats and Oils Olive oil or canola oil. Walnuts and almonds. The items listed above may not be a complete list of recommended foods or beverages. Contact your dietitian for more options. What foods are not recommended? Foods high in calories or fat. Fried foods. Sweets. Products made from refined white flour, including white bread, pastries, white rice, and pasta. The items listed above may not be a complete list of foods and beverages to avoid. Contact your dietitian for more information. This information is not intended to replace advice given to you by your health care provider. Make sure you discuss any questions you have with your health care provider. Document Released: 07/24/2015 Document Revised: 09/07/2015 Document Reviewed: 04/13/2014 Elsevier Interactive Patient Education  2018 Elsevier Inc.  

## 2017-11-12 NOTE — Progress Notes (Signed)
Discussed with patient on phone

## 2017-11-14 LAB — CBC WITH DIFFERENTIAL/PLATELET
BASOS ABS: 37 {cells}/uL (ref 0–200)
Basophils Relative: 0.5 %
EOS ABS: 266 {cells}/uL (ref 15–500)
Eosinophils Relative: 3.6 %
HCT: 37 % (ref 35.0–45.0)
Hemoglobin: 12.7 g/dL (ref 11.7–15.5)
Lymphs Abs: 1954 cells/uL (ref 850–3900)
MCH: 30.1 pg (ref 27.0–33.0)
MCHC: 34.3 g/dL (ref 32.0–36.0)
MCV: 87.7 fL (ref 80.0–100.0)
MONOS PCT: 6.5 %
MPV: 9.8 fL (ref 7.5–12.5)
Neutro Abs: 4662 cells/uL (ref 1500–7800)
Neutrophils Relative %: 63 %
PLATELETS: 331 10*3/uL (ref 140–400)
RBC: 4.22 10*6/uL (ref 3.80–5.10)
RDW: 11.9 % (ref 11.0–15.0)
TOTAL LYMPHOCYTE: 26.4 %
WBC mixed population: 481 cells/uL (ref 200–950)
WBC: 7.4 10*3/uL (ref 3.8–10.8)

## 2017-11-14 LAB — COMPLETE METABOLIC PANEL WITH GFR
AG RATIO: 1.9 (calc) (ref 1.0–2.5)
ALT: 24 U/L (ref 6–29)
AST: 13 U/L (ref 10–30)
Albumin: 4.6 g/dL (ref 3.6–5.1)
Alkaline phosphatase (APISO): 63 U/L (ref 33–115)
BILIRUBIN TOTAL: 0.3 mg/dL (ref 0.2–1.2)
BUN: 14 mg/dL (ref 7–25)
CHLORIDE: 105 mmol/L (ref 98–110)
CO2: 25 mmol/L (ref 20–32)
Calcium: 9.2 mg/dL (ref 8.6–10.2)
Creat: 0.59 mg/dL (ref 0.50–1.10)
GFR, EST AFRICAN AMERICAN: 149 mL/min/{1.73_m2} (ref >=60)
GFR, Est Non African American: 128 mL/min/{1.73_m2} (ref >=60)
Globulin: 2.4 g/dL (calc) (ref 1.9–3.7)
Glucose, Bld: 83 mg/dL (ref 65–99)
POTASSIUM: 4.1 mmol/L (ref 3.5–5.3)
Sodium: 136 mmol/L (ref 135–146)
TOTAL PROTEIN: 7 g/dL (ref 6.1–8.1)

## 2017-11-14 LAB — CORTISOL: Cortisol, Plasma: 8.9 ug/dL

## 2017-11-14 LAB — TESTOSTERONE, TOTAL, LC/MS/MS: TESTOSTERONE, TOTAL, LC-MS-MS: 29 ng/dL (ref 2–45)

## 2017-11-14 LAB — PROLACTIN: Prolactin: 15.7 ng/mL

## 2017-11-14 LAB — LIPID PANEL W/REFLEX DIRECT LDL
Cholesterol: 197 mg/dL (ref <200)
HDL: 46 mg/dL — ABNORMAL LOW (ref >50)
LDL CHOLESTEROL (CALC): 126 mg/dL — AB
NON-HDL CHOLESTEROL (CALC): 151 mg/dL — AB (ref <130)
TRIGLYCERIDES: 139 mg/dL (ref <150)
Total CHOL/HDL Ratio: 4.3 (calc) (ref <5.0)

## 2017-11-14 LAB — FSH/LH
FSH: 1.2 m[IU]/mL — ABNORMAL LOW
LH: 3.5 m[IU]/mL

## 2017-11-14 LAB — TSH: TSH: 2.3 mIU/L

## 2017-11-14 LAB — ACTH: C206 ACTH: 17 pg/mL (ref 6–50)

## 2017-11-14 LAB — DHEA-SULFATE: DHEA SO4: 317 ug/dL (ref 18–391)

## 2017-11-14 LAB — 17-HYDROXYPROGESTERONE: 17-OH-Progesterone, LC/MS/MS: 192 ng/dL

## 2017-11-14 LAB — ESTRADIOL: ESTRADIOL: 147 pg/mL

## 2017-11-14 MED ORDER — METFORMIN HCL 500 MG PO TABS: 500.0000 mg | ORAL_TABLET | Freq: Two times a day (BID) | ORAL | 3 refills | Status: DC

## 2017-11-14 NOTE — Addendum Note (Signed)
Addended by: Jomarie LongsBREEBACK, Chayna Surratt L on: 11/14/2017 05:41 PM   Modules accepted: Orders

## 2017-11-14 NOTE — Progress Notes (Signed)
Discussed with patient. No signs of PCOS except low FSH. Will start metformin to see if helps in general with weight loss.

## 2017-11-19 DIAGNOSIS — R7989 Other specified abnormal findings of blood chemistry: Secondary | ICD-10-CM | POA: Insufficient documentation

## 2017-11-19 DIAGNOSIS — N911 Secondary amenorrhea: Secondary | ICD-10-CM | POA: Insufficient documentation

## 2017-11-19 MED ORDER — AMPHETAMINE-DEXTROAMPHET ER 20 MG PO CP24: 20.0000 mg | ORAL_CAPSULE | ORAL | 0 refills | Status: DC

## 2017-12-22 ENCOUNTER — Encounter: Payer: Self-pay | Admitting: Obstetrics & Gynecology

## 2017-12-22 ENCOUNTER — Ambulatory Visit: Payer: Managed Care, Other (non HMO) | Admitting: Obstetrics & Gynecology

## 2017-12-22 VITALS — BP 125/80 | HR 102 | Resp 16 | Ht 65.0 in | Wt 222.0 lb

## 2017-12-22 DIAGNOSIS — N912 Amenorrhea, unspecified: Secondary | ICD-10-CM

## 2017-12-22 DIAGNOSIS — Z3200 Encounter for pregnancy test, result unknown: Secondary | ICD-10-CM

## 2017-12-22 DIAGNOSIS — Z3201 Encounter for pregnancy test, result positive: Secondary | ICD-10-CM | POA: Diagnosis not present

## 2017-12-22 LAB — POCT URINE PREGNANCY: Preg Test, Ur: POSITIVE — AB

## 2017-12-22 NOTE — Progress Notes (Signed)
  Spouse-[name]

## 2017-12-22 NOTE — Progress Notes (Signed)
   Subjective:    Patient ID: Crystal Bernard, female    DOB: 03-Feb-1991, 27 y.o.   MRN: [id number]  HPI 27 yo married G1P0 is here for pregnancy confirmation. She was initally coming here as a consult for amenorrhea, probable PCOS, but she had 7 + home UPTs. She is taking PNVs daily and is having mild nausea.   Review of Systems     Objective:   Physical Exam Breathing, conversing, and ambulating normally Well nourished, well hydrated White female, no apparent distress UPT here +     Assessment & Plan:  Approximately [redacted] weeks EGA Come back for NOB visit at [redacted] weeks EGA

## 2018-01-06 ENCOUNTER — Encounter: Payer: Self-pay | Admitting: Physician Assistant

## 2018-01-06 ENCOUNTER — Ambulatory Visit (INDEPENDENT_AMBULATORY_CARE_PROVIDER_SITE_OTHER): Payer: Managed Care, Other (non HMO) | Admitting: Physician Assistant

## 2018-01-06 VITALS — BP 108/75 | HR 84 | Ht 65.0 in | Wt 225.0 lb

## 2018-01-06 DIAGNOSIS — O219 Vomiting of pregnancy, unspecified: Secondary | ICD-10-CM

## 2018-01-06 DIAGNOSIS — Z566 Other physical and mental strain related to work: Secondary | ICD-10-CM

## 2018-01-06 MED ORDER — DOXYLAMINE-PYRIDOXINE ER 20-20 MG PO TBCR: 1.0000 | EXTENDED_RELEASE_TABLET | Freq: Two times a day (BID) | ORAL | 2 refills | Status: DC

## 2018-01-06 MED ORDER — DOXYLAMINE-PYRIDOXINE 10-10 MG PO TBEC: 2.0000 | DELAYED_RELEASE_TABLET | Freq: Every day | ORAL | 0 refills | Status: DC

## 2018-01-06 NOTE — Progress Notes (Signed)
Consulted by Tandy GawJade Breeback PA-C for IV access.  Inserted #22 gauge, 1-inch BD Insyte angiocatheter in the left antecubital vein on 1 attempt. Good blood return. Flushed with normal saline. IV fluids per primary treatment team.  Gena Frayharley Cummings PA-C

## 2018-01-06 NOTE — Progress Notes (Signed)
   Subjective:    Patient ID: Crystal AlpersShelby-Anne Bernard, female    DOB: 04/29/1990, 27 y.o.   MRN: [id number]  HPI Pt is 27 yo female who is about [redacted] weeks pregnant who presents to the clinic today feeling "faint", "weak" with persistent nausea with bouts of vomiting for about 2 weeks. She has her first OB appt next week. She is drinking water, sweet tea, juice but having problems keeping food down. She vomits a few times a day. She has been doing ginger with no help. No fever, chills, body aches, or abdominal pain. She does well with the pedialyte freezer pops. She is having a lot of problems working. She had to stop her ADHD medication and she is forgetting a lot. She does not feel like her boss understands. She feels very emotional because she doesn't feel good and she can't seem to do her job.   .. Active Ambulatory Problems    Diagnosis Date Noted  . Attention deficit hyperactivity disorder (ADHD), predominantly inattentive type 03/04/2016  . Right knee injury 05/03/2016  . Morbid obesity (HCC) 03/21/2017  . Abnormal weight gain 03/21/2017  . Skin lesion of breast 09/14/2017  . Secondary amenorrhea 11/19/2017  . Low serum follicle stimulating hormone [facility]) 11/19/2017   Resolved Ambulatory Problems    Diagnosis Date Noted  . No Resolved Ambulatory Problems   Past Medical History:  Diagnosis Date  . Amenorrhea      Review of Systems  All other systems reviewed and are negative.      Objective:   Physical Exam  Constitutional: She is oriented to person, place, and time. She appears well-developed and well-nourished.  HENT:  Head: Normocephalic and atraumatic.  Eyes: Conjunctivae are normal.  Cardiovascular: Normal rate and regular rhythm.  Pulmonary/Chest: Effort normal and breath sounds normal.  Neurological: She is alert and oriented to person, place, and time. She exhibits normal muscle tone.  Skin: No rash noted. No erythema. There is pallor.  Psychiatric: She has a normal  mood and affect. Her behavior is normal.          Assessment & Plan:  Marland Kitchen.Marland Kitchen.Diagnoses and all orders for this visit:  Nausea and vomiting in pregnancy -     Doxylamine-Pyridoxine ER (BONJESTA) 20-20 MG TBCR; Take 1 tablet by mouth 2 (two) times daily.  Stress at work  Other orders -     Discontinue: Doxylamine-Pyridoxine 10-10 MG TBEC; Take 2 tablets by mouth at bedtime.   Reassured patient that her vitals were good. 1 bag of NS fluids were given today. See note. She did feel better after these. Contacted OB next door and samples of Bonjesta were started with prescription given for refill.  Discussed this medication is the only approved nausea medication in pregnancy. Hopefully this could help with nausea and vomiting. Continue with OB follow ups as scheduled. I did write patient out of work for the rest of the week. She will return on Monday.

## 2018-01-06 NOTE — Patient Instructions (Signed)

## 2018-01-07 ENCOUNTER — Encounter: Payer: Self-pay | Admitting: Physician Assistant

## 2018-01-07 ENCOUNTER — Ambulatory Visit: Payer: Managed Care, Other (non HMO) | Admitting: Physician Assistant

## 2018-01-26 ENCOUNTER — Ambulatory Visit (INDEPENDENT_AMBULATORY_CARE_PROVIDER_SITE_OTHER): Payer: Managed Care, Other (non HMO) | Admitting: Obstetrics & Gynecology

## 2018-01-26 ENCOUNTER — Encounter: Payer: Self-pay | Admitting: Obstetrics & Gynecology

## 2018-01-26 ENCOUNTER — Other Ambulatory Visit (HOSPITAL_COMMUNITY)
Admission: RE | Admit: 2018-01-26 | Payer: Managed Care, Other (non HMO) | Source: Ambulatory Visit | Admitting: Obstetrics & Gynecology

## 2018-01-26 DIAGNOSIS — B9689 Other specified bacterial agents as the cause of diseases classified elsewhere: Secondary | ICD-10-CM

## 2018-01-26 DIAGNOSIS — B373 Candidiasis of vulva and vagina: Secondary | ICD-10-CM

## 2018-01-26 DIAGNOSIS — N760 Acute vaginitis: Secondary | ICD-10-CM | POA: Diagnosis not present

## 2018-01-26 DIAGNOSIS — Z3687 Encounter for antenatal screening for uncertain dates: Secondary | ICD-10-CM

## 2018-01-26 DIAGNOSIS — Z124 Encounter for screening for malignant neoplasm of cervix: Secondary | ICD-10-CM

## 2018-01-26 DIAGNOSIS — N898 Other specified noninflammatory disorders of vagina: Secondary | ICD-10-CM

## 2018-01-26 DIAGNOSIS — Z3401 Encounter for supervision of normal first pregnancy, first trimester: Secondary | ICD-10-CM

## 2018-01-26 DIAGNOSIS — Z3400 Encounter for supervision of normal first pregnancy, unspecified trimester: Secondary | ICD-10-CM | POA: Insufficient documentation

## 2018-01-26 DIAGNOSIS — Z113 Encounter for screening for infections with a predominantly sexual mode of transmission: Secondary | ICD-10-CM | POA: Diagnosis not present

## 2018-01-26 LAB — POCT CBG (FASTING - GLUCOSE)-MANUAL ENTRY: GLUCOSE FASTING, POC: 92 mg/dL (ref 70–99)

## 2018-01-26 NOTE — Progress Notes (Signed)
Bedside U/S shows single IUP with FHT of 169 BPM and CRL  30.42mm  GA is [redacted]w[redacted]d

## 2018-01-26 NOTE — Progress Notes (Signed)
Pt has never had pap smear 

## 2018-01-26 NOTE — Progress Notes (Signed)
  Subjective:    Crystal Bernard is a G1P0 [redacted]w[redacted]d being seen today for her first obstetrical visit.  Her obstetrical history is significant for obesity and PCO/irregular menstrual cycles. Patient does intend to breast feed. Pregnancy history fully reviewed.  Patient reports nausea.  Vitals:   01/26/18 0857  BP: 109/70  Pulse: 72  Weight: 229 lb (103.9 kg)    HISTORY: OB History  Gravida Para Term Preterm AB Living  1            SAB TAB Ectopic Multiple Live Births               # Outcome Date GA Lbr Len/2nd Weight Sex Delivery Anes PTL Lv  1 Current            Past Medical History:  Diagnosis Date  . Amenorrhea    Past Surgical History:  Procedure Laterality Date  . WISDOM TOOTH EXTRACTION     Family History  Problem Relation Age of Onset  . Alcohol abuse Father   . Depression Father   . Hyperlipidemia Father   . Hypertension Father   . Leukemia Maternal Grandmother      Exam    Uterus:     Pelvic Exam:    Perineum: No Hemorrhoids   Vulva: normal   Vagina:  normal mucosa, wet prep done   pH: n/a   Cervix: no cervical motion tenderness   Adnexa: not evaluated   Bony Pelvis: average  System: Breast:  normal appearance, no masses or tenderness   Skin: normal coloration and turgor, no rashes    Neurologic: oriented, normal mood   Extremities: normal strength, tone, and muscle mass   HEENT extra ocular movement intact, sclera clear, anicteric, oropharynx clear, no lesions, neck supple with midline trachea, thyroid without masses and trachea midline   Mouth/Teeth mucous membranes moist, pharynx normal without lesions and dental hygiene good   Neck supple and no masses   Cardiovascular: regular rate and rhythm   Respiratory:  appears well, vitals normal, no respiratory distress, acyanotic, normal RR, chest clear, no wheezing, crepitations, rhonchi, normal symmetric air entry   Abdomen: soft, non-tender; bowel sounds normal; no masses,  no organomegaly   Urinary: urethral meatus normal      Assessment:    Pregnancy: G1P0 Patient Active Problem List   Diagnosis Date Noted  . Supervision of normal first pregnancy 01/26/2018  . Secondary amenorrhea 11/19/2017  . Low serum follicle stimulating hormone (FSH) 11/19/2017  . Skin lesion of breast 09/14/2017  . Morbid obesity (HCC) 03/21/2017  . Abnormal weight gain 03/21/2017  . Right knee injury 05/03/2016  . Attention deficit hyperactivity disorder (ADHD), predominantly inattentive type 03/04/2016        Plan:     Initial labs drawn. Prenatal vitamins. Problem list reviewed and updated. Genetic Screening discussed: Panorama with AFP  Ultrasound discussed; fetal survey: ordered.  Follow up in 5 weeks.  1.  Babyscripts optimized schedule 2.  Given borderline PCO and obesity--check Hgb A1c and fasting glucose serum (fasting CBG = 92 today) 3.  Dating by MP c/w [place] today 4.  Weight gain 12-20 pounds 5.  Continue Diclegis   Elsie Lincoln 01/26/2018

## 2018-01-27 LAB — COMPREHENSIVE METABOLIC PANEL
AG RATIO: 1.6 (calc) (ref 1.0–2.5)
ALT: 14 U/L (ref 6–29)
AST: 14 U/L (ref 10–30)
Albumin: 3.9 g/dL (ref 3.6–5.1)
Alkaline phosphatase (APISO): 45 U/L (ref 33–115)
BUN: 7 mg/dL (ref 7–25)
CHLORIDE: 105 mmol/L (ref 98–110)
CO2: 21 mmol/L (ref 20–32)
Calcium: 8.7 mg/dL (ref 8.6–10.2)
Creat: 0.5 mg/dL (ref 0.50–1.10)
GLOBULIN: 2.4 g/dL (ref 1.9–3.7)
GLUCOSE: 77 mg/dL (ref 65–99)
Potassium: 3.8 mmol/L (ref 3.5–5.3)
SODIUM: 135 mmol/L (ref 135–146)
TOTAL PROTEIN: 6.3 g/dL (ref 6.1–8.1)
Total Bilirubin: 0.3 mg/dL (ref 0.2–1.2)

## 2018-01-27 LAB — CYTOLOGY - PAP
BACTERIAL VAGINITIS: POSITIVE — AB
CANDIDA VAGINITIS: POSITIVE — AB
Chlamydia: NEGATIVE
Diagnosis: NEGATIVE
Neisseria Gonorrhea: NEGATIVE

## 2018-02-03 ENCOUNTER — Telehealth: Payer: Self-pay

## 2018-02-03 DIAGNOSIS — B9689 Other specified bacterial agents as the cause of diseases classified elsewhere: Secondary | ICD-10-CM

## 2018-02-03 DIAGNOSIS — N760 Acute vaginitis: Principal | ICD-10-CM

## 2018-02-03 DIAGNOSIS — B379 Candidiasis, unspecified: Secondary | ICD-10-CM

## 2018-02-03 MED ORDER — TERCONAZOLE 0.4 % VA CREA: 1.0000 | TOPICAL_CREAM | Freq: Every day | VAGINAL | 0 refills | Status: DC

## 2018-02-03 MED ORDER — METRONIDAZOLE 500 MG PO TABS: 500.0000 mg | ORAL_TABLET | Freq: Two times a day (BID) | ORAL | 0 refills | Status: DC

## 2018-02-03 NOTE — Telephone Encounter (Signed)
Left message on pt's phone letting her know she has a yeast infection and BV. I also let her know I sent the Rx to pharmacy on file.

## 2018-02-04 LAB — URINE CULTURE, OB REFLEX

## 2018-02-04 LAB — CULTURE, OB URINE

## 2018-02-05 LAB — CYSTIC FIBROSIS DIAGNOSTIC STUDY

## 2018-02-05 LAB — OBSTETRIC PANEL
ANTIBODY SCREEN: NOT DETECTED
BASOS PCT: 0.2 %
Basophils Absolute: 17 cells/uL (ref 0–200)
EOS PCT: 1.8 %
Eosinophils Absolute: 151 cells/uL (ref 15–500)
HCT: 34.6 % — ABNORMAL LOW (ref 35.0–45.0)
Hemoglobin: 11.9 g/dL (ref 11.7–15.5)
Hepatitis B Surface Ag: NONREACTIVE
LYMPHS ABS: 1621 {cells}/uL (ref 850–3900)
MCH: 30.4 pg (ref 27.0–33.0)
MCHC: 34.4 g/dL (ref 32.0–36.0)
MCV: 88.3 fL (ref 80.0–100.0)
MPV: 9.7 fL (ref 7.5–12.5)
Monocytes Relative: 5.5 %
NEUTROS ABS: 6149 {cells}/uL (ref 1500–7800)
Neutrophils Relative %: 73.2 %
Platelets: 313 10*3/uL (ref 140–400)
RBC: 3.92 10*6/uL (ref 3.80–5.10)
RDW: 12.2 % (ref 11.0–15.0)
RPR Ser Ql: NONREACTIVE
RUBELLA: 3.98 {index}
Total Lymphocyte: 19.3 %
WBC: 8.4 10*3/uL (ref 3.8–10.8)
WBCMIX: 462 {cells}/uL (ref 200–950)

## 2018-02-05 LAB — HIV ANTIBODY (ROUTINE TESTING W REFLEX): HIV 1&2 Ab, 4th Generation: NONREACTIVE

## 2018-02-05 LAB — HEMOGLOBIN A1C
EAG (MMOL/L): 5.4 (calc)
Hgb A1c MFr Bld: 5 % of total Hgb (ref <5.7)
Mean Plasma Glucose: 97 (calc)

## 2018-02-08 ENCOUNTER — Other Ambulatory Visit: Payer: Self-pay | Admitting: Physician Assistant

## 2018-02-09 ENCOUNTER — Ambulatory Visit: Payer: Managed Care, Other (non HMO)

## 2018-02-10 ENCOUNTER — Encounter: Payer: Self-pay | Admitting: *Deleted

## 2018-02-10 ENCOUNTER — Ambulatory Visit: Payer: Managed Care, Other (non HMO) | Admitting: *Deleted

## 2018-02-10 VITALS — Temp 98.4°F

## 2018-02-10 DIAGNOSIS — M545 Low back pain, unspecified: Secondary | ICD-10-CM

## 2018-02-10 DIAGNOSIS — Z3480 Encounter for supervision of other normal pregnancy, unspecified trimester: Secondary | ICD-10-CM

## 2018-02-10 LAB — POCT URINALYSIS DIPSTICK
Bilirubin, UA: NEGATIVE
Glucose, UA: NEGATIVE
Ketones, UA: NEGATIVE
Nitrite, UA: NEGATIVE
PH UA: 5 (ref 5.0–8.0)
PROTEIN UA: NEGATIVE
Spec Grav, UA: 1.02 (ref 1.010–1.025)
UROBILINOGEN UA: NEGATIVE U/dL — AB

## 2018-02-10 NOTE — Progress Notes (Signed)
Pt here with c/o's of lower back pain for several days.  She denies any pain with urination.  She is using a warming pad that helps some but states that her pain is still there.  Urinalysis shows mod leukocytes.  This was a clean catch urine. Urine C and S sent.  Advised to use Tylenol PRN but she states that she doesn't like to take meds.  Will notify her with results.

## 2018-02-13 ENCOUNTER — Other Ambulatory Visit: Payer: Self-pay | Admitting: Family Medicine

## 2018-02-13 NOTE — Progress Notes (Signed)
Received call from after-hours nurse regarding continued flank pain. Patient had U/A dip and culture collected in office yesterday because of flank pain. Nurse now states she is having continued flank pain and suprapubic discomfort but no dysuria or frequency. Tylenol is helping flank pain. Should be nearing completion of treatment for BV with metronidazole. Had 1000 CFUs of GBS on initial urine culture which was not treated given CFU count, asymptomatic. Nurse states patient denies any fever, dysuria, frequency. Given pending culture with flank pain, will treat with cephalexin and await follow-up of urine culture. Will discontinue antibiotic if no culture growth. Instructed RN to tell patient to follow-up in clinic.   Crystal Bernard. Earlene Plater, DO OB/GYN Fellow

## 2018-02-14 ENCOUNTER — Other Ambulatory Visit: Payer: Self-pay | Admitting: Obstetrics and Gynecology

## 2018-02-14 MED ORDER — CEPHALEXIN 500 MG PO CAPS: 500.0000 mg | ORAL_CAPSULE | Freq: Four times a day (QID) | ORAL | 0 refills | Status: DC

## 2018-02-16 ENCOUNTER — Encounter: Payer: Self-pay | Admitting: Obstetrics & Gynecology

## 2018-02-16 DIAGNOSIS — R8271 Bacteriuria: Secondary | ICD-10-CM | POA: Insufficient documentation

## 2018-02-16 DIAGNOSIS — Z3401 Encounter for supervision of normal first pregnancy, first trimester: Secondary | ICD-10-CM

## 2018-02-16 LAB — URINE CULTURE, OB REFLEX

## 2018-02-16 LAB — CULTURE, OB URINE

## 2018-02-16 NOTE — Addendum Note (Signed)
Addended by: Kathie Dike on: 02/16/2018 03:30 PM   Modules accepted: Orders

## 2018-03-03 ENCOUNTER — Ambulatory Visit (INDEPENDENT_AMBULATORY_CARE_PROVIDER_SITE_OTHER): Payer: Self-pay | Admitting: Advanced Practice Midwife

## 2018-03-03 VITALS — BP 111/65 | HR 89 | Wt 229.0 lb

## 2018-03-03 DIAGNOSIS — Z3400 Encounter for supervision of normal first pregnancy, unspecified trimester: Secondary | ICD-10-CM

## 2018-03-03 DIAGNOSIS — O2342 Unspecified infection of urinary tract in pregnancy, second trimester: Secondary | ICD-10-CM

## 2018-03-03 DIAGNOSIS — Z3402 Encounter for supervision of normal first pregnancy, second trimester: Secondary | ICD-10-CM

## 2018-03-03 DIAGNOSIS — R102 Pelvic and perineal pain: Secondary | ICD-10-CM

## 2018-03-03 DIAGNOSIS — M5432 Sciatica, left side: Secondary | ICD-10-CM

## 2018-03-03 DIAGNOSIS — B951 Streptococcus, group B, as the cause of diseases classified elsewhere: Secondary | ICD-10-CM

## 2018-03-03 DIAGNOSIS — O26899 Other specified pregnancy related conditions, unspecified trimester: Secondary | ICD-10-CM

## 2018-03-03 NOTE — Patient Instructions (Addendum)
Round Ligament Pain The round ligament is a cord of muscle and tissue that helps to support the uterus. It can become a source of pain during pregnancy if it becomes stretched or twisted as the baby grows. The pain usually begins in the second trimester of pregnancy, and it can come and go until the baby is delivered. It is not a serious problem, and it does not cause harm to the baby. Round ligament pain is usually a short, sharp, and pinching pain, but it can also be a dull, lingering, and aching pain. The pain is felt in the lower side of the abdomen or in the groin. It usually starts deep in the groin and moves up to the outside of the hip area. Pain can occur with:  A sudden change in position.  Rolling over in bed.  Coughing or sneezing.  Physical activity.  Follow these instructions at home: Watch your condition for any changes. Take these steps to help with your pain:  When the pain starts, relax. Then try: ? Sitting down. ? Flexing your knees up to your abdomen. ? Lying on your side with one pillow under your abdomen and another pillow between your legs. ? Sitting in a warm bath for 15-20 minutes or until the pain goes away.  Take over-the-counter and prescription medicines only as told by your health care provider.  Move slowly when you sit and stand.  Avoid long walks if they cause pain.  Stop or lessen your physical activities if they cause pain.  Contact a health care provider if:  Your pain does not go away with treatment.  You feel pain in your back that you did not have before.  Your medicine is not helping. Get help right away if:  You develop a fever or chills.  You develop uterine contractions.  You develop vaginal bleeding.  You develop nausea or vomiting.  You develop diarrhea.  You have pain when you urinate. This information is not intended to replace advice given to you by your health care provider. Make sure you discuss any questions you have  with your health care provider. Document Released: 01/09/2008 Document Revised: 09/07/2015 Document Reviewed: 06/08/2014 Elsevier Interactive Patient Education  2018 [employer].    Sciatica Rehab Ask your health care provider which exercises are safe for you. Do exercises exactly as told by your health care provider and adjust them as directed. It is normal to feel mild stretching, pulling, tightness, or discomfort as you do these exercises, but you should stop right away if you feel sudden pain or your pain gets worse.Do not begin these exercises until told by your health care provider. Stretching and range of motion exercises These exercises warm up your muscles and joints and improve the movement and flexibility of your hips and your back. These exercises also help to relieve pain, numbness, and tingling. Exercise A: Sciatic nerve glide 1. Sit in a chair with your head facing down toward your chest. Place your hands behind your back. Let your shoulders slump forward. 2. Slowly straighten one of your knees while you tilt your head back as if you are looking toward the ceiling. Only straighten your leg as far as you can without making your symptoms worse. 3. Hold for __________ seconds. 4. Slowly return to the starting position. 5. Repeat with your other leg. Repeat __________ times. Complete this exercise __________ times a day. Exercise B: Knee to chest with hip adduction and internal rotation  1. Lie on  your back on a firm surface with both legs straight. 2. Bend one of your knees and move it up toward your chest until you feel a gentle stretch in your lower back and buttock. Then, move your knee toward the shoulder that is on the opposite side from your leg. ? Hold your leg in this position by holding onto the front of your knee. 3. Hold for __________ seconds. 4. Slowly return to the starting position. 5. Repeat with your other leg. Repeat __________ times. Complete this exercise  __________ times a day. Exercise C: Prone extension on elbows  1. Lie on your abdomen on a firm surface. A bed may be too soft for this exercise. 2. Prop yourself up on your elbows. 3. Use your arms to help lift your chest up until you feel a gentle stretch in your abdomen and your lower back. ? This will place some of your body weight on your elbows. If this is uncomfortable, try stacking pillows under your chest. ? Your hips should stay down, against the surface that you are lying on. Keep your hip and back muscles relaxed. 4. Hold for __________ seconds. 5. Slowly relax your upper body and return to the starting position. Repeat __________ times. Complete this exercise __________ times a day. Strengthening exercises These exercises build strength and endurance in your back. Endurance is the ability to use your muscles for a long time, even after they get tired. Exercise D: Pelvic tilt 1. Lie on your back on a firm surface. Bend your knees and keep your feet flat. 2. Tense your abdominal muscles. Tip your pelvis up toward the ceiling and flatten your lower back into the floor. ? To help with this exercise, you may place a small towel under your lower back and try to push your back into the towel. 3. Hold for __________ seconds. 4. Let your muscles relax completely before you repeat this exercise. Repeat __________ times. Complete this exercise __________ times a day. Exercise E: Alternating arm and leg raises  1. Get on your hands and knees on a firm surface. If you are on a hard floor, you may want to use padding to cushion your knees, such as an exercise mat. 2. Line up your arms and legs. Your hands should be below your shoulders, and your knees should be below your hips. 3. Lift your left leg behind you. At the same time, raise your right arm and straighten it in front of you. ? Do not lift your leg higher than your hip. ? Do not lift your arm higher than your shoulder. ? Keep your  abdominal and back muscles tight. ? Keep your hips facing the ground. ? Do not arch your back. ? Keep your balance carefully, and do not hold your breath. 4. Hold for __________ seconds. 5. Slowly return to the starting position and repeat with your right leg and your left arm. Repeat __________ times. Complete this exercise __________ times a day. Posture and body mechanics  Body mechanics refers to the movements and positions of your body while you do your daily activities. Posture is part of body mechanics. Good posture and healthy body mechanics can help to relieve stress in your body's tissues and joints. Good posture means that your spine is in its natural S-curve position (your spine is neutral), your shoulders are pulled back slightly, and your head is not tipped forward. The following are general guidelines for applying improved posture and body mechanics to your everyday activities.  Standing   When standing, keep your spine neutral and your feet about hip-width apart. Keep a slight bend in your knees. Your ears, shoulders, and hips should line up.  When you do a task in which you stand in one place for a long time, place one foot up on a stable object that is 2-4 inches (5-10 cm) high, such as a footstool. This helps keep your spine neutral. Sitting   When sitting, keep your spine neutral and keep your feet flat on the floor. Use a footrest, if necessary, and keep your thighs parallel to the floor. Avoid rounding your shoulders, and avoid tilting your head forward.  When working at a desk or a computer, keep your desk at a height where your hands are slightly lower than your elbows. Slide your chair under your desk so you are close enough to maintain good posture.  When working at a computer, place your monitor at a height where you are looking straight ahead and you do not have to tilt your head forward or downward to look at the screen. Resting   When lying down and resting,  avoid positions that are most painful for you.  If you have pain with activities such as sitting, bending, stooping, or squatting (flexion-based activities), lie in a position in which your body does not bend very much. For example, avoid curling up on your side with your arms and knees near your chest (fetal position).  If you have pain with activities such as standing for a long time or reaching with your arms (extension-based activities), lie with your spine in a neutral position and bend your knees slightly. Try the following positions: ? Lying on your side with a pillow between your knees. ? Lying on your back with a pillow under your knees. Lifting   When lifting objects, keep your feet at least shoulder-width apart and tighten your abdominal muscles.  Bend your knees and hips and keep your spine neutral. It is important to lift using the strength of your legs, not your back. Do not lock your knees straight out.  Always ask for help to lift heavy or awkward objects. This information is not intended to replace advice given to you by your health care provider. Make sure you discuss any questions you have with your health care provider. Document Released: 04/01/2005 Document Revised: 12/07/2015 Document Reviewed: 12/16/2014 Elsevier Interactive Patient Education  [employer]2018 Else[employer] Inc.

## 2018-03-03 NOTE — Progress Notes (Signed)
   PRENATAL VISIT NOTE  Subjective:  Crystal Bernard is a 27 y.o. G1P0 at 1454w2d being seen today for ongoing prenatal care.  She is currently monitored for the following issues for this low-risk pregnancy and has Attention deficit hyperactivity disorder (ADHD), predominantly inattentive type; Right knee injury; Morbid obesity (HCC); Abnormal weight gain; Skin lesion of breast; Secondary amenorrhea; Low serum follicle stimulating hormone [facility](FSH); Supervision of normal first pregnancy; and Group B streptococcal bacteriuria on their problem list.  Patient reports backache and bilateral groin pain when in bed.   .  .   . Denies leaking of fluid.   The following portions of the patient's history were reviewed and updated as appropriate: allergies, current medications, past family history, past medical history, past social history, past surgical history and problem list. Problem list updated.  Objective:  There were no vitals filed for this visit.  Fetal Status:           General:  Alert, oriented and cooperative. Patient is in no acute distress.  Skin: Skin is warm and dry. No rash noted.   Cardiovascular: Normal heart rate noted  Respiratory: Normal respiratory effort, no problems with respiration noted  Abdomen: Soft, gravid, appropriate for gestational age.        Pelvic: Cervical exam deferred        Extremities: Normal range of motion.     Mental Status: Normal mood and affect. Normal behavior. Normal judgment and thought content.   Assessment and Plan:  Pregnancy: G1P0 at 8654w2d  1. Supervision of normal first pregnancy, antepartum --Anticipatory guidance about next visits/weeks of pregnancy given.  2. Sciatica of left side --Pain in left lower back radiates down left leg.  Discussed stretches, use of ice, Tylenol, pregnancy support belt later in pregnancy.  3. Pain of round ligament affecting pregnancy, antepartum --Bilateral groin pain with movement, especially when changing  positions in bed.  Rest/ice/heat/warm bath/Tylenol.   --Preterm labor precautions/reasons to seek care reviewed.  4. GBS (group b Streptococcus) UTI complicating pregnancy, second trimester --Initial urine culture with 1000 colonies GBS so not treated. Pt with back pain so Keflex abx sent to pharmacy, pt completed course. --TOC today --Discussed need for prophylaxis in labor, questions answered.  Preterm labor symptoms and general obstetric precautions including but not limited to vaginal bleeding, contractions, leaking of fluid and fetal movement were reviewed in detail with the patient. Please refer to After Visit Summary for other counseling recommendations.  Return in about 4 weeks (around 03/31/2018).  Future Appointments  Date Time Provider Department Center  03/30/2018  8:30 AM [facility] US 1 [facility]    Sharen CounterLisa Leftwich-Kirby, CNM

## 2018-03-16 ENCOUNTER — Encounter: Payer: Self-pay | Admitting: Physician Assistant

## 2018-03-16 ENCOUNTER — Ambulatory Visit (INDEPENDENT_AMBULATORY_CARE_PROVIDER_SITE_OTHER): Payer: BLUE CROSS/BLUE SHIELD | Admitting: Physician Assistant

## 2018-03-16 VITALS — BP 114/65 | HR 98 | Temp 98.2°F | Ht 65.0 in | Wt 228.0 lb

## 2018-03-16 DIAGNOSIS — J0100 Acute maxillary sinusitis, unspecified: Secondary | ICD-10-CM

## 2018-03-16 MED ORDER — AZITHROMYCIN 250 MG PO TABS: ORAL_TABLET | ORAL | 0 refills | Status: DC

## 2018-03-16 NOTE — Patient Instructions (Signed)

## 2018-03-16 NOTE — Progress Notes (Signed)
   Subjective:    Patient ID: Crystal Bernard, female    DOB: 01/21/1991, 27 y.o.   MRN: [id number]  HPI  Patient is a 27 year old female who is [redacted] weeks pregnant who presents to the clinic with 2 weeks of sinus pressure, sinus congestion, bilateral ear pain, teeth pain and headache.  She has been using over-the-counter cold and cough preparations with little to no relief.  She has had a productive cough at times which causes her upper chest and lower abdomen to feel tight and achy.  She denies any vaginal bleeding.  She denies any fever, chills, body aches.  .. Active Ambulatory Problems    Diagnosis Date Noted  . Attention deficit hyperactivity disorder (ADHD), predominantly inattentive type 03/04/2016  . Right knee injury 05/03/2016  . Morbid obesity (HCC) 03/21/2017  . Abnormal weight gain 03/21/2017  . Skin lesion of breast 09/14/2017  . Secondary amenorrhea 11/19/2017  . Low serum follicle stimulating hormone (FSH) 11/19/2017  . Supervision of normal first pregnancy 01/26/2018  . Group B streptococcal bacteriuria 02/16/2018   Resolved Ambulatory Problems    Diagnosis Date Noted  . No Resolved Ambulatory Problems   Past Medical History:  Diagnosis Date  . Amenorrhea       Review of Systems  All other systems reviewed and are negative.      Objective:   Physical Exam  Constitutional: She is oriented to person, place, and time. She appears well-developed and well-nourished.  HENT:  Head: Normocephalic and atraumatic.  TM's with bilateral effusions and erythema.  Tenderness over maxillary sinuses.  Oropharynx erythematous with some PND.  Bilateral nasal turbinates red and swollen.   Cardiovascular: Normal rate and regular rhythm.  Pulmonary/Chest: Effort normal and breath sounds normal.  Neurological: She is alert and oriented to person, place, and time.  Psychiatric: She has a normal mood and affect. Her behavior is normal.          Assessment & Plan:   Marland Kitchen.Marland Kitchen.Crystal was seen today for sinus problem.  Diagnoses and all orders for this visit:  Acute non-recurrent maxillary sinusitis -     azithromycin (ZITHROMAX) 250 MG tablet; Take 2 tablets now and then one tablet for 4 days.   Patient was treated for sinusitis with Z-Pak.  Patient reassured this was safe for pregnancy.  I did give her a handout of over-the-counter options that are safe in pregnancy.  Encouraged sinus rinses and steam baths.  Follow-up as needed.

## 2018-03-23 ENCOUNTER — Encounter (HOSPITAL_COMMUNITY): Payer: Self-pay

## 2018-03-30 ENCOUNTER — Other Ambulatory Visit: Payer: Self-pay | Admitting: Obstetrics & Gynecology

## 2018-03-30 ENCOUNTER — Encounter (HOSPITAL_COMMUNITY): Payer: Self-pay

## 2018-03-30 ENCOUNTER — Other Ambulatory Visit (HOSPITAL_COMMUNITY): Payer: Self-pay | Admitting: *Deleted

## 2018-03-30 ENCOUNTER — Ambulatory Visit (HOSPITAL_COMMUNITY)
Admission: RE | Admit: 2018-03-30 | Discharge: 2018-03-30 | Disposition: A | Discharge status: Home / Self Care | Payer: BLUE CROSS/BLUE SHIELD | Source: Ambulatory Visit | Attending: Obstetrics & Gynecology | Admitting: Obstetrics & Gynecology

## 2018-03-30 DIAGNOSIS — Z362 Encounter for other antenatal screening follow-up: Secondary | ICD-10-CM

## 2018-03-30 DIAGNOSIS — Z363 Encounter for antenatal screening for malformations: Secondary | ICD-10-CM | POA: Diagnosis not present

## 2018-03-30 DIAGNOSIS — Z3401 Encounter for supervision of normal first pregnancy, first trimester: Secondary | ICD-10-CM

## 2018-03-30 DIAGNOSIS — O99212 Obesity complicating pregnancy, second trimester: Secondary | ICD-10-CM | POA: Insufficient documentation

## 2018-03-30 DIAGNOSIS — Z3A19 19 weeks gestation of pregnancy: Secondary | ICD-10-CM | POA: Diagnosis not present

## 2018-03-31 ENCOUNTER — Ambulatory Visit (INDEPENDENT_AMBULATORY_CARE_PROVIDER_SITE_OTHER): Payer: BLUE CROSS/BLUE SHIELD | Admitting: Certified Nurse Midwife

## 2018-03-31 VITALS — BP 123/73 | HR 87 | Wt 234.0 lb

## 2018-03-31 DIAGNOSIS — Z23 Encounter for immunization: Secondary | ICD-10-CM | POA: Diagnosis not present

## 2018-03-31 DIAGNOSIS — B3731 Acute candidiasis of vulva and vagina: Secondary | ICD-10-CM

## 2018-03-31 DIAGNOSIS — R8271 Bacteriuria: Secondary | ICD-10-CM

## 2018-03-31 DIAGNOSIS — O2342 Unspecified infection of urinary tract in pregnancy, second trimester: Secondary | ICD-10-CM

## 2018-03-31 DIAGNOSIS — B373 Candidiasis of vulva and vagina: Secondary | ICD-10-CM

## 2018-03-31 DIAGNOSIS — Z3402 Encounter for supervision of normal first pregnancy, second trimester: Secondary | ICD-10-CM

## 2018-03-31 MED ORDER — TERCONAZOLE 0.4 % VA CREA: 1.0000 | TOPICAL_CREAM | Freq: Every day | VAGINAL | 0 refills | Status: DC

## 2018-03-31 NOTE — Progress Notes (Signed)
Subjective:  Crystal Bernard is a 27 y.o. G1P0 at 2053w2d being seen today for ongoing prenatal care.  She is currently monitored for the following issues for this low-risk pregnancy and has Attention deficit hyperactivity disorder (ADHD), predominantly inattentive type; Right knee injury; Morbid obesity (HCC); Skin lesion of breast; Supervision of normal first pregnancy; and Group B streptococcal bacteriuria on their problem list.  Patient reports vaginal itching and white discharge, feels certain having another yeast infection.  Contractions: Not present. Vag. Bleeding: None.  Movement: Present. Denies leaking of fluid.   The following portions of the patient's history were reviewed and updated as appropriate: allergies, current medications, past family history, past medical history, past social history, past surgical history and problem list. Problem list updated.  Objective:   Vitals:   03/31/18 0904  BP: 123/73  Pulse: 87  Weight: 106.1 kg    Fetal Status: Fetal Heart Rate (bpm): 147 Fundal Height: 19 cm Movement: Present     General:  Alert, oriented and cooperative. Patient is in no acute distress.  Skin: Skin is warm and dry. No rash noted.   Cardiovascular: Normal heart rate noted  Respiratory: Normal respiratory effort, no problems with respiration noted  Abdomen: Soft, gravid, appropriate for gestational age. Pain/Pressure: Absent     Pelvic: Vag. Bleeding: None Vag D/C Character: Thin   Cervical exam deferred        Extremities: Normal range of motion.  Edema: None  Mental Status: Normal mood and affect. Normal behavior. Normal judgment and thought content.   Urinalysis:      Assessment and Plan:  Pregnancy: G1P0 at 25153w2d  1. Encounter for supervision of normal first pregnancy in second trimester  2. UTI in pregnancy - treated, TOC today - Culture, OB Urine  3. Yeast vaginitis - terconazole (TERAZOL 7) 0.4 % vaginal cream; Place 1 applicator vaginally at bedtime.   Dispense: 45 g; Refill: 0  4. Needs flu shot - Flu Vaccine QUAD 36+ mos IM  5. Group B streptococcal bacteriuria - needs abx in labor  Preterm labor symptoms and general obstetric precautions including but not limited to vaginal bleeding, contractions, leaking of fluid and fetal movement were reviewed in detail with the patient. Please refer to After Visit Summary for other counseling recommendations.  Return in about 4 weeks (around 04/28/2018).   Donette LarryBhambri, Crystal Bernard, CNM

## 2018-04-02 LAB — URINE CULTURE, OB REFLEX

## 2018-04-02 LAB — CULTURE, OB URINE

## 2018-04-06 ENCOUNTER — Ambulatory Visit (INDEPENDENT_AMBULATORY_CARE_PROVIDER_SITE_OTHER): Payer: BLUE CROSS/BLUE SHIELD | Admitting: Obstetrics & Gynecology

## 2018-04-06 DIAGNOSIS — O4692 Antepartum hemorrhage, unspecified, second trimester: Secondary | ICD-10-CM

## 2018-04-06 DIAGNOSIS — O26892 Other specified pregnancy related conditions, second trimester: Secondary | ICD-10-CM | POA: Diagnosis not present

## 2018-04-06 DIAGNOSIS — N898 Other specified noninflammatory disorders of vagina: Secondary | ICD-10-CM

## 2018-04-06 DIAGNOSIS — Z113 Encounter for screening for infections with a predominantly sexual mode of transmission: Secondary | ICD-10-CM | POA: Diagnosis not present

## 2018-04-06 DIAGNOSIS — Z3402 Encounter for supervision of normal first pregnancy, second trimester: Secondary | ICD-10-CM

## 2018-04-06 DIAGNOSIS — O99212 Obesity complicating pregnancy, second trimester: Secondary | ICD-10-CM

## 2018-04-06 MED ORDER — METRONIDAZOLE 500 MG PO TABS: 500.0000 mg | ORAL_TABLET | Freq: Two times a day (BID) | ORAL | 0 refills | Status: DC

## 2018-04-06 NOTE — Progress Notes (Signed)
She is here for a work in visit because she had some spotting this morning. She has not had sex recently. She denies ROM or any other problems.  Her cervix is friable and bleeds to the touch. Discharge c/w BV  Wet prep sent Flagyl prescribed. Keep appt

## 2018-04-07 LAB — CERVICOVAGINAL ANCILLARY ONLY
Bacterial vaginitis: NEGATIVE
Candida vaginitis: POSITIVE — AB
TRICH (WINDOWPATH): NEGATIVE

## 2018-04-09 ENCOUNTER — Telehealth: Payer: Self-pay

## 2018-04-09 ENCOUNTER — Other Ambulatory Visit: Payer: Self-pay | Admitting: Obstetrics & Gynecology

## 2018-04-09 MED ORDER — FLUCONAZOLE 150 MG PO TABS: 150.0000 mg | ORAL_TABLET | Freq: Once | ORAL | 3 refills | Status: AC

## 2018-04-09 NOTE — Telephone Encounter (Addendum)
Left message on pt's phone letting her know results  ----- Message from Allie BossierMyra C Dove, MD sent at 04/09/2018  8:20 AM EST ----- Please let her know that her labs showed that she has yeast. I prescribed diflucan. Thanks

## 2018-04-09 NOTE — Progress Notes (Unsigned)
Diflucan prescribed

## 2018-04-27 ENCOUNTER — Ambulatory Visit (HOSPITAL_COMMUNITY): Payer: BLUE CROSS/BLUE SHIELD

## 2018-04-27 ENCOUNTER — Encounter (HOSPITAL_COMMUNITY): Payer: Self-pay

## 2018-05-08 ENCOUNTER — Telehealth: Payer: Self-pay | Admitting: *Deleted

## 2018-05-08 NOTE — Telephone Encounter (Signed)
2nd attempt to contact patient to inform her that she filled out her medical release form incorrectly and we need the correct information to send records. Left patient 2nd message in a week.

## 2018-06-02 ENCOUNTER — Encounter: Payer: BLUE CROSS/BLUE SHIELD | Admitting: Certified Nurse Midwife

## 2018-07-08 ENCOUNTER — Ambulatory Visit (INDEPENDENT_AMBULATORY_CARE_PROVIDER_SITE_OTHER): Payer: BLUE CROSS/BLUE SHIELD

## 2018-07-08 ENCOUNTER — Other Ambulatory Visit: Payer: Self-pay

## 2018-07-08 ENCOUNTER — Encounter: Payer: Self-pay | Admitting: Family Medicine

## 2018-07-08 ENCOUNTER — Ambulatory Visit (INDEPENDENT_AMBULATORY_CARE_PROVIDER_SITE_OTHER): Payer: BLUE CROSS/BLUE SHIELD | Admitting: Family Medicine

## 2018-07-08 VITALS — BP 109/73 | HR 92 | Temp 98.4°F | Wt 250.0 lb

## 2018-07-08 DIAGNOSIS — S93492A Sprain of other ligament of left ankle, initial encounter: Secondary | ICD-10-CM | POA: Diagnosis not present

## 2018-07-08 DIAGNOSIS — M25572 Pain in left ankle and joints of left foot: Secondary | ICD-10-CM

## 2018-07-08 DIAGNOSIS — M7989 Other specified soft tissue disorders: Secondary | ICD-10-CM | POA: Diagnosis not present

## 2018-07-08 NOTE — Patient Instructions (Addendum)
Thank you for coming in today.  Apply ice.  Use the aircast as needed.  Use compression as needed.  Tylenol is ok for pain.   Work on range of motion exercises.  Recheck via WebEx vidoe-chat in 2 weeks.  Let me know if not doing well.    Ankle Sprain, Phase I Rehab Ask your health care provider which exercises are safe for you. Do exercises exactly as told by your health care provider and adjust them as directed. It is normal to feel mild stretching, pulling, tightness, or discomfort as you do these exercises, but you should stop right away if you feel sudden pain or your pain gets worse.Do not begin these exercises until told by your health care provider. Stretching and range of motion exercises These exercises warm up your muscles and joints and improve the movement and flexibility of your lower leg and ankle. These exercises also help to relieve pain and stiffness. Exercise A: Gastroc and soleus stretch  1. Sit on the floor with your left / right leg extended. 2. Loop a belt or towel around the ball of your left / right foot. The ball of your foot is on the walking surface, right under your toes. 3. Keep your left / right ankle and foot relaxed and keep your knee straight while you use the belt or towel to pull your foot toward you. You should feel a gentle stretch behind your calf or knee. 4. Hold this position for __________ seconds, then release to the starting position. Repeat the exercise with your knee bent. You can put a pillow or a rolled bath towel under your knee to support it. You should feel a stretch deep in your calf or at your Achilles tendon. Repeat each stretch __________ times. Complete these stretches __________ times a day. Exercise B: Ankle alphabet  1. Sit with your left / right leg supported at the lower leg. ? Do not rest your foot on anything. ? Make sure your foot has room to move freely. 2. Think of your left / right foot as a paintbrush, and move your foot  to trace each letter of the alphabet in the air. Keep your hip and knee still while you trace. Make the letters as large as you can without feeling discomfort. 3. Trace every letter from A to Z. Repeat __________ times. Complete this exercise __________ times a day. Strengthening exercises These exercises build strength and endurance in your ankle and lower leg. Endurance is the ability to use your muscles for a long time, even after they get tired. Exercise C: Dorsiflexors  1. Secure a rubber exercise band or tube to an object, such as a table leg, that will stay still when the band is pulled. Secure the other end around your left / right foot. 2. Sit on the floor facing the object, with your left / right leg extended. The band or tube should be slightly tense when your foot is relaxed. 3. Slowly bring your foot toward you, pulling the band tighter. 4. Hold this position for __________ seconds. 5. Slowly return your foot to the starting position. Repeat __________ times. Complete this exercise __________ times a day. Exercise D: Plantar flexors  1. Sit on the floor with your left / right leg extended. 2. Loop a rubber exercise tube or band around the ball of your left / right foot. The ball of your foot is on the walking surface, right under your toes. ? Hold the ends of the  band or tube in your hands. ? The band or tube should be slightly tense when your foot is relaxed. 3. Slowly point your foot and toes downward, pushing them away from you. 4. Hold this position for __________ seconds. 5. Slowly return your foot to the starting position. Repeat __________ times. Complete this exercise __________ times a day. Exercise E: Evertors 1. Sit on the floor with your legs straight out in front of you. 2. Loop a rubber exercise band or tube around the ball of your left / right foot. The ball of your foot is on the walking surface, right under your toes. ? Hold the ends of the band in your hands,  or secure the band to a stable object. ? The band or tube should be slightly tense when your foot is relaxed. 3. Slowly push your foot outward, away from your other leg. 4. Hold this position for __________ seconds. 5. Slowly return your foot to the starting position. Repeat __________ times. Complete this exercise __________ times a day. This information is not intended to replace advice given to you by your health care provider. Make sure you discuss any questions you have with your health care provider. Document Released: 10/31/2004 Document Revised: 09/16/2016 Document Reviewed: 02/13/2015 Elsevier Interactive Patient Education  2019 [employer].

## 2018-07-08 NOTE — Progress Notes (Signed)
Crystal Bernard is a 28 y.o. female who presents to [facility] today for ankle injury.  Crystal Bernard fell this morning injuring her left ankle.  She notes pain and swelling and scheduled an appointment with me today.  She has difficulty walking.  She notes a history of prior ankle fractures.  She thinks her current symptoms are consistent with prior fractures.  Her current medical history is significant for pregnancy.  She is approximately [redacted] weeks pregnant.   She notes pain and swelling in the lateral ankle.  She denies any significant foot pain.  She notes a little bit of numbness to the dorsal aspect of her foot.  She feels well otherwise with no fevers or chills nausea vomiting or diarrhea.    ROS:  As above  Exam:  BP 109/73   Pulse 92   Temp 98.4 F (36.9 C) (Oral)   Wt 250 lb (113.4 kg)   LMP 11/16/2017   BMI 41.60 kg/m   Wt Readings from Last 5 Encounters:  07/08/18 250 lb (113.4 kg)  04/06/18 236 lb (107 kg)  03/31/18 234 lb (106.1 kg)  03/30/18 234 lb 3.2 oz (106.2 kg)  03/16/18 228 lb (103.4 kg)   General: Well Developed, well nourished, and in no acute distress.  Neuro/Psych: Alert and oriented x3, extra-ocular muscles intact, able to move all 4 extremities, sensation grossly intact. Skin: Warm and dry, no rashes noted.  Respiratory: Not using accessory muscles, speaking in full sentences, trachea midline.  Cardiovascular: Pulses palpable, no extremity edema. Abdomen: Does not appear distended. MSK: Left leg: Left knee nontender normal motion. Left ankle swollen and tender at the lateral aspect of the ankle. Decreased ankle motion due to pain. Patient guards with ankle exam ligamentous testing not done. Left foot normal-appearing Not particularly tender.  Intact sensation to dorsal foot however patient has a ticklish sensation with light touch to the dorsal lateral foot.  Normal sensation to touch with the medial  foot plantar foot and heel. Pulses are intact dorsal pedis and posterior tibialis. Capillary refill is intact  Patient is unable to bear weight    Lab and Radiology Results No results found for this or any previous visit (from the past 72 hour(s)). Dg Ankle Complete Left  Result Date: 07/08/2018 CLINICAL DATA:  Lateral left ankle pain and swelling. EXAM: LEFT ANKLE COMPLETE - 3+ VIEW COMPARISON:  None. FINDINGS: Left ankle is located without a fracture. Soft tissue swelling particularly along the medial aspect. Alignment of the left ankle is normal. IMPRESSION: Soft tissue swelling without acute bone abnormality. Electronically Signed   By: Richarda Overlie M.D.   On: 07/08/2018 16:29   I personally (independently) visualized and performed the interpretation of the images attached in this note.     Assessment and Plan: 28 y.o. female with left ankle sprain.  X-ray does not show fracture.  Discussed different treatment options.  Plan for clamshell Aircast and a walker at home.  Tylenol rest ice and elevation for pain control.  Compression also is reasonable.  Recheck via [employer] visit in 2 weeks.  If dramatically worsening or significantly abnormal will check back in person however would like to keep Crystal Bernard out of the clinic if possible to reduce exposure to COVID-19.     Orders Placed This Encounter  Procedures  . DG Ankle Complete Left    Standing Status:   Future    Number of Occurrences:   1  Standing Expiration Date:   09/07/2019    Order Specific Question:   Reason for Exam (SYMPTOM  OR DIAGNOSIS REQUIRED)    Answer:   injury ? fx    Order Specific Question:   Is patient pregnant?    Answer:   No    Order Specific Question:   Preferred imaging location?    Answer:   [facility]    Order Specific Question:   Radiology Contrast Protocol - do NOT remove file path    Answer:   \\[email or web address].pdf   No  orders of the defined types were placed in this encounter.   Historical information moved to improve visibility of documentation.  Past Medical History:  Diagnosis Date  . Amenorrhea    Past Surgical History:  Procedure Laterality Date  . WISDOM TOOTH EXTRACTION     Social History   Tobacco Use  . Smoking status: Never Smoker  . Smokeless tobacco: Never Used  Substance Use Topics  . Alcohol use: Not Currently   family history includes Alcohol abuse in her father; Depression in her father; Hyperlipidemia in her father; Hypertension in her father; Leukemia in her maternal grandmother.  Medications: Current Outpatient Medications  Medication Sig Dispense Refill  . azithromycin (ZITHROMAX) 250 MG tablet Take 2 tablets now and then one tablet for 4 days. (Patient not taking: Reported on 03/30/2018) 6 tablet 0  . Doxylamine-Pyridoxine ER (BONJESTA) 20-20 MG TBCR Take 1 tablet by mouth 2 (two) times daily. (Patient not taking: Reported on 03/31/2018) 60 tablet 2  . metroNIDAZOLE (FLAGYL) 500 MG tablet Take 1 tablet (500 mg total) by mouth 2 (two) times daily. 14 tablet 0  . Prenatal Vit-Fe Fumarate-FA (TH PRENATAL VITAMINS PO) Take by mouth.    . terconazole (TERAZOL 7) 0.4 % vaginal cream Place 1 applicator vaginally at bedtime. 45 g 0   No current facility-administered medications for this visit.    Allergies  Allergen Reactions  . Eggs Or Egg-Derived Products Other (See Comments)    Unknown reaction  . Fish Allergy     "Positive Reaction on Allergy Testing"      Discussed warning signs or symptoms. Please see discharge instructions. Patient expresses understanding.

## 2018-10-13 ENCOUNTER — Encounter: Payer: Self-pay | Admitting: Physician Assistant

## 2018-10-13 ENCOUNTER — Telehealth: Payer: Self-pay | Admitting: *Deleted

## 2018-10-13 ENCOUNTER — Ambulatory Visit (INDEPENDENT_AMBULATORY_CARE_PROVIDER_SITE_OTHER): Payer: Self-pay | Admitting: Physician Assistant

## 2018-10-13 VITALS — Ht 65.0 in

## 2018-10-13 DIAGNOSIS — R509 Fever, unspecified: Secondary | ICD-10-CM

## 2018-10-13 DIAGNOSIS — R52 Pain, unspecified: Secondary | ICD-10-CM

## 2018-10-13 NOTE — Telephone Encounter (Signed)
-----   Message from Donella Stade, [place] sent at 10/13/2018  1:58 PM EDT ----- 2 days of fever and body aches. 79 week old son has fever too. Needs Covid 19 testing.

## 2018-10-13 NOTE — Telephone Encounter (Signed)
Called patient to schedule covid testing but she was currently being tested at [facility].

## 2018-10-13 NOTE — Progress Notes (Signed)
Patient ID: Crystal Bernard, female   DOB: April 06, 1991, 28 y.o.   MRN: [id number] .Marland KitchenVirtual Visit via Telephone Note  I connected with Crystal Bernard on 10/13/18 at  3:40 PM EDT by telephone and verified that I am speaking with the correct person using two identifiers.  Location: Patient: home Provider: clinic   I discussed the limitations, risks, security and privacy concerns of performing an evaluation and management service by telephone and the availability of in person appointments. I also discussed with the patient that there may be a patient responsible charge related to this service. The patient expressed understanding and agreed to proceed.   History of Present Illness: Pt is a 28 yo female who has a 46 week old baby at home that has fever, chills, body aches for last 2 days. Her 34 week old son is running a fever too. No cough, SOB, wheezing. She has not gone back to work. She is concerned about COVID. Not tried anything to make better.   .. Active Ambulatory Problems    Diagnosis Date Noted  . Attention deficit hyperactivity disorder (ADHD), predominantly inattentive type 03/04/2016  . Right knee injury 05/03/2016  . Morbid obesity ([place]) 03/21/2017  . Skin lesion of breast 09/14/2017  . Supervision of normal first pregnancy 01/26/2018  . Group B streptococcal bacteriuria 02/16/2018   Resolved Ambulatory Problems    Diagnosis Date Noted  . Abnormal weight gain 03/21/2017  . Secondary amenorrhea 11/19/2017  . Low serum follicle stimulating hormone [facility]) 11/19/2017   Past Medical History:  Diagnosis Date  . Amenorrhea    Reviewed med, allergy, problem list.     Observations/Objective: No acute distress.  Normal breathing.  Normal mood.   No vitals collected.   Assessment and Plan: Marland KitchenMarland KitchenShelby-Anne was seen today for fever.  Diagnoses and all orders for this visit:  Fever, unspecified fever cause  Body aches   Order placed for COVID testing. Symptomatic  care discussed with tylenol. Any worsening SOB or breathing call office or go to ED.   Follow Up Instructions:    I discussed the assessment and treatment plan with the patient. The patient was provided an opportunity to ask questions and all were answered. The patient agreed with the plan and demonstrated an understanding of the instructions.   The patient was advised to call back or seek an in-person evaluation if the symptoms worsen or if the condition fails to improve as anticipated.  I provided 5 minutes of non-face-to-face time during this encounter.   Iran Planas, PA-C

## 2018-10-16 ENCOUNTER — Telehealth: Payer: Self-pay | Admitting: Physician Assistant

## 2018-10-16 MED ORDER — AZITHROMYCIN 250 MG PO TABS: ORAL_TABLET | ORAL | 0 refills | Status: DC

## 2018-10-16 NOTE — Telephone Encounter (Signed)
Pt negative for COVID. She continues to have a lot of sinus pressure, headache, congestion. Cough is worse at night. No fever. Tried OTC cold medications. Sent zpak.

## 2018-11-15 ENCOUNTER — Encounter (HOSPITAL_COMMUNITY): Payer: Self-pay

## 2018-12-01 ENCOUNTER — Encounter: Payer: Self-pay | Admitting: Physician Assistant

## 2018-12-01 ENCOUNTER — Ambulatory Visit (INDEPENDENT_AMBULATORY_CARE_PROVIDER_SITE_OTHER): Payer: BLUE CROSS/BLUE SHIELD | Admitting: Physician Assistant

## 2018-12-01 VITALS — Ht 65.0 in | Wt 220.0 lb

## 2018-12-01 DIAGNOSIS — S39012A Strain of muscle, fascia and tendon of lower back, initial encounter: Secondary | ICD-10-CM

## 2018-12-01 MED ORDER — MELOXICAM 15 MG PO TABS: 15.0000 mg | ORAL_TABLET | Freq: Every day | ORAL | 0 refills | Status: DC

## 2018-12-01 MED ORDER — PREDNISONE 50 MG PO TABS: ORAL_TABLET | ORAL | 0 refills | Status: DC

## 2018-12-01 MED ORDER — CYCLOBENZAPRINE HCL 10 MG PO TABS: 10.0000 mg | ORAL_TABLET | Freq: Three times a day (TID) | ORAL | 0 refills | Status: DC | PRN

## 2018-12-01 NOTE — Progress Notes (Deleted)
Moved last week 3 months post partum  Showering 2 days ago, after bending over her back started hurting Pain - worse with moving, sharp pains, okay when sitting still, hard to stand up

## 2018-12-02 DIAGNOSIS — S39012A Strain of muscle, fascia and tendon of lower back, initial encounter: Secondary | ICD-10-CM | POA: Insufficient documentation

## 2018-12-02 NOTE — Progress Notes (Signed)
Patient ID: Crystal Bernard, female   DOB: [birth year], 28 y.o.   MRN: [id number] .Marland KitchenVirtual Visit via Video Note  I connected with Crystal Bernard on 12/02/18 at 10:10 AM EDT by a video enabled telemedicine application and verified that I am speaking with the correct person using two identifiers.  Location: Patient: home  Provider: clinic   I discussed the limitations of evaluation and management by telemedicine and the availability of in person appointments. The patient expressed understanding and agreed to proceed.  History of Present Illness: Patient is a 28 year old female who is 3 months postpartum of a baby boy who calls into the clinic with low back pain.  2 days ago she bent over in the shower and felt a sudden pull in her lower back and found it hard to completely stand back up.  She ended up calling her husband into the shower to help her stand up straight.  Initially she had a lot of pain and tightness running down the backs of her legs and into her thighs.  With some symptomatic care she does feel that these things have gotten better.  She did move into a new house last week and do a lot of painting.  Pain is made better by laying down.  Pain is made worse with standing, walking, twisting and sitting for longer periods of time.  She has tried some ibuprofen which helps some.  She is not breast-feeding.  She denies any history of back injuries.  She denies any saddle anesthesia, bowel or bladder dysfunction, numbness and tingling of her lower legs.  Active Ambulatory Problems    Diagnosis Date Noted  . Attention deficit hyperactivity disorder (ADHD), predominantly inattentive type 03/04/2016  . Right knee injury 05/03/2016  . Morbid obesity ([place]) 03/21/2017  . Skin lesion of breast 09/14/2017  . Supervision of normal first pregnancy 01/26/2018  . Group B streptococcal bacteriuria 02/16/2018   Resolved Ambulatory Problems    Diagnosis Date Noted  . Abnormal weight gain  03/21/2017  . Secondary amenorrhea 11/19/2017  . Low serum follicle stimulating hormone [facility]) 11/19/2017   Past Medical History:  Diagnosis Date  . Amenorrhea    Reviewed med, allergy, problem list.     Observations/Objective: No acute distress.  Limited ROM at waist due to pain and discomfort.  Pt appears she can lift her lower extremities without too much pain.  She is able to walk but painful to stand.   .. Today's Vitals   12/01/18 1024  Weight: 220 lb (99.8 kg)  Height: 5\' 5"  (1.651 m)   Body mass index is 36.61 kg/m.   Assessment and Plan: Marland KitchenMarland KitchenDiagnoses and all orders for this visit:  Low back strain, initial encounter -     cyclobenzaprine (FLEXERIL) 10 MG tablet; Take 1 tablet (10 mg total) by mouth 3 (three) times daily as needed for muscle spasms. -     meloxicam (MOBIC) 15 MG tablet; Take 1 tablet (15 mg total) by mouth daily. -     predniSONE (DELTASONE) 50 MG tablet; One tab PO daily for 5 days.   No red flags for low back pain.  Likely low back strain.  There could be some minimal pooling of spine and disc involvement with her pain that radiates into her thighs.  That seems to be getting better.  We will treat with prednisone burst, Flexeril as needed, start meloxicam as needed after prednisone.  Encouraged heat, TENS unit, icy hot patches during the day and discuss extension low  back exercises.  If not significantly improving in 2 weeks or if worsening follow-up in the office.  Follow Up Instructions:    I discussed the assessment and treatment plan with the patient. The patient was provided an opportunity to ask questions and all were answered. The patient agreed with the plan and demonstrated an understanding of the instructions.   The patient was advised to call back or seek an in-person evaluation if the symptoms worsen or if the condition fails to improve as anticipated.    Tandy GawJade Oluwasemilore Bahl, PA-C

## 2019-02-22 IMAGING — US US TRANSVAGINAL NON-OB
1 series · 14 of 25 positions shown · non-contrast
Comparison: None

CLINICAL DATA: Amenorrhea. No period for 65 days after stopping
birth control pills.



[Series 1: us transvaginal non-ob · 0.14mm/px · 14 of 79 slices shown]
[im 1/79]
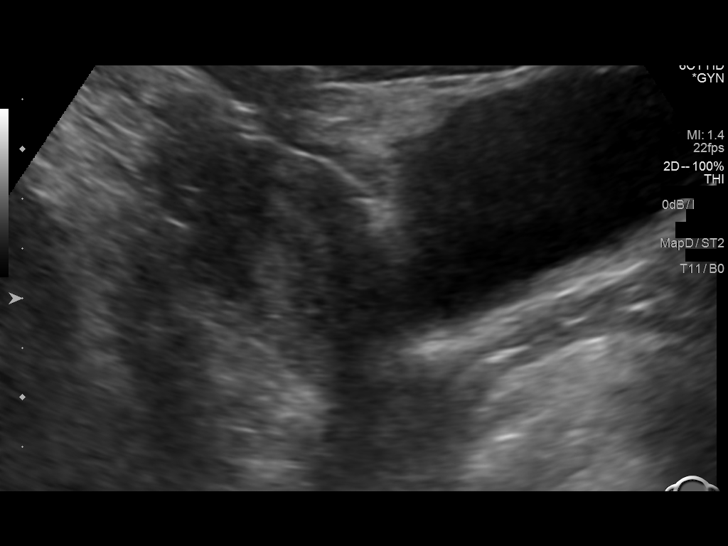
[im 7/79]
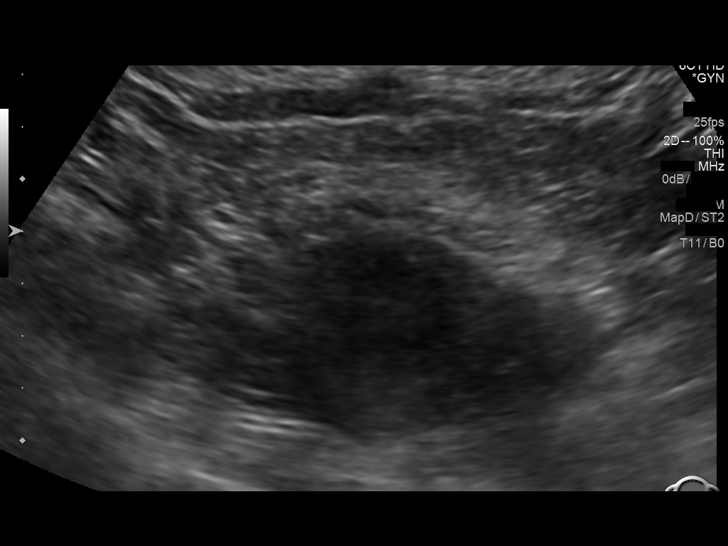
[im 14/79]
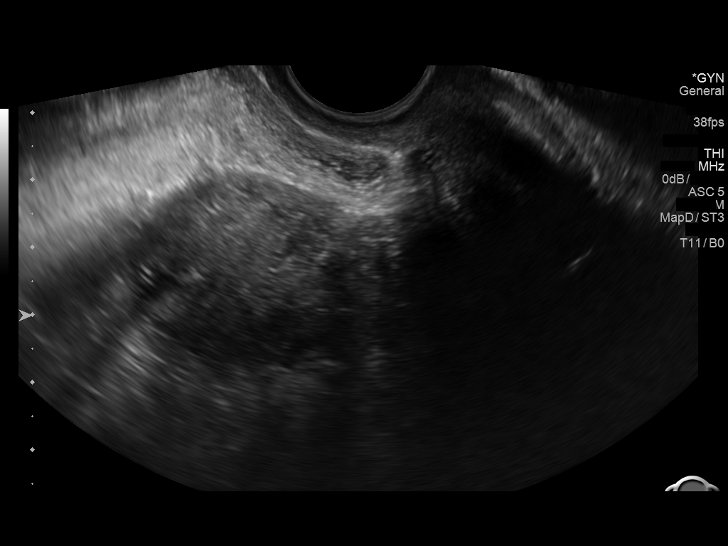
[im 20/79]
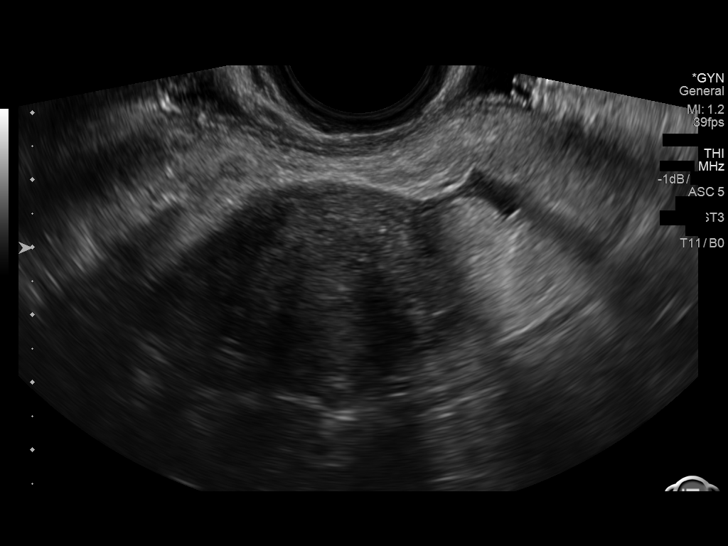
[im 27/79]
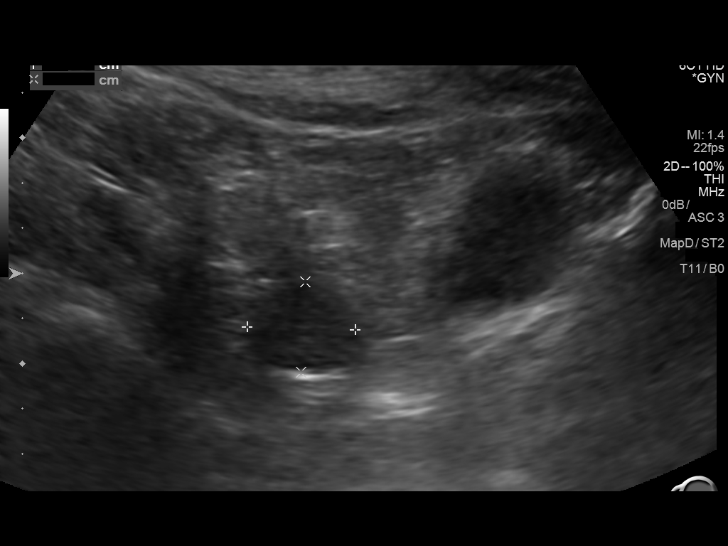
[im 30/79]
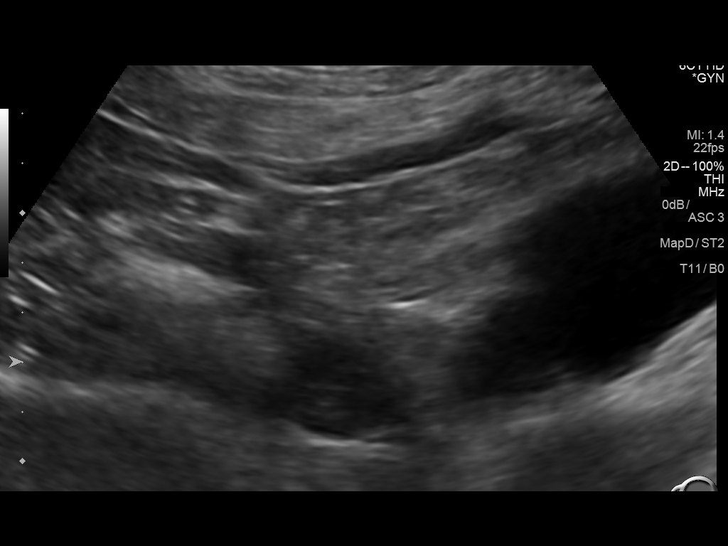
[im 36/79]
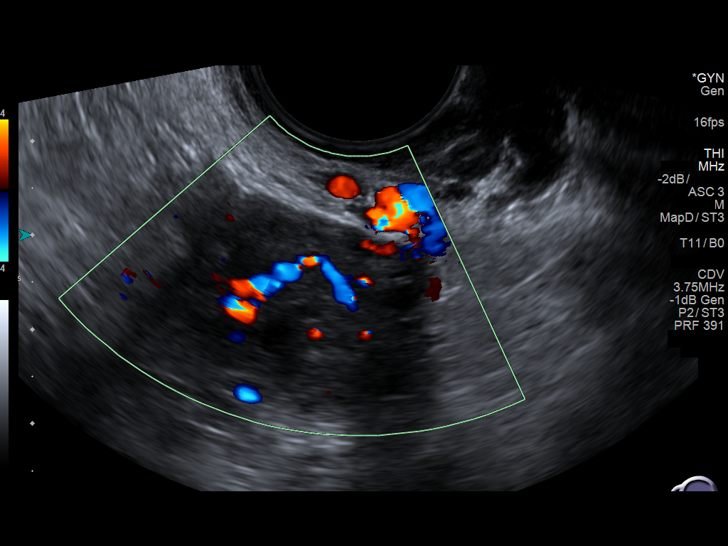
[im 43/79]
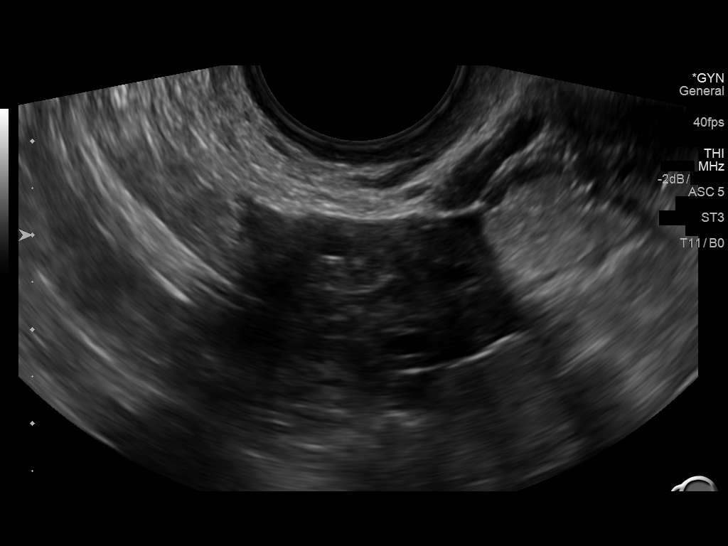
[im 49/79]
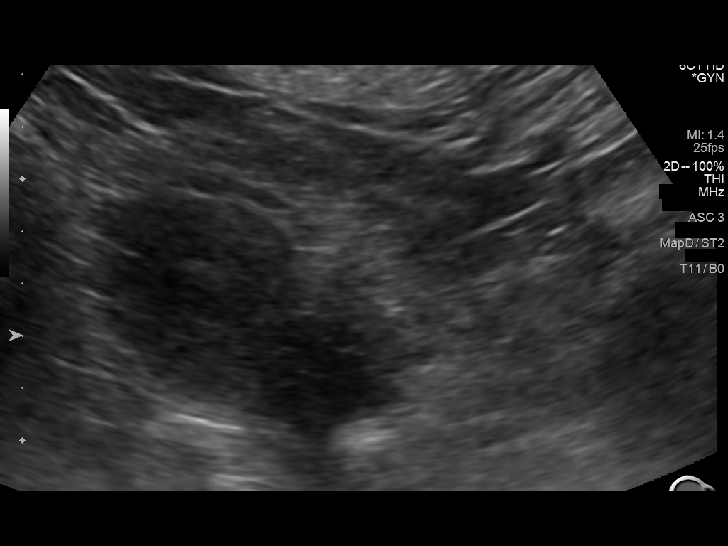
[im 53/79]
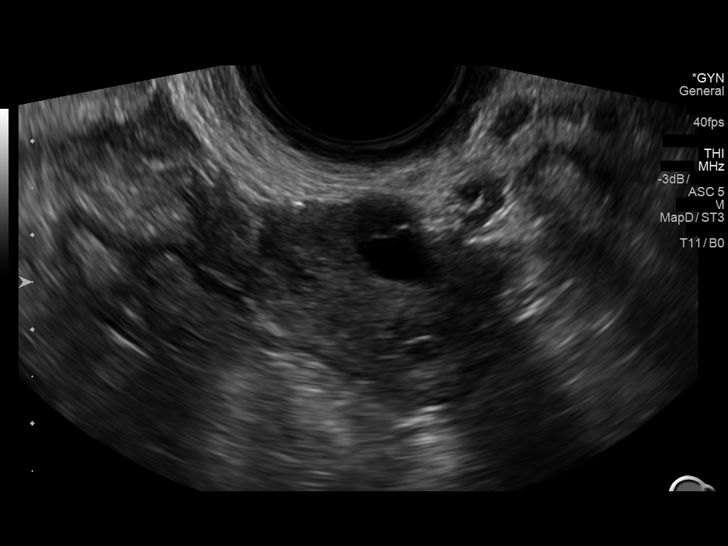
[im 59/79]
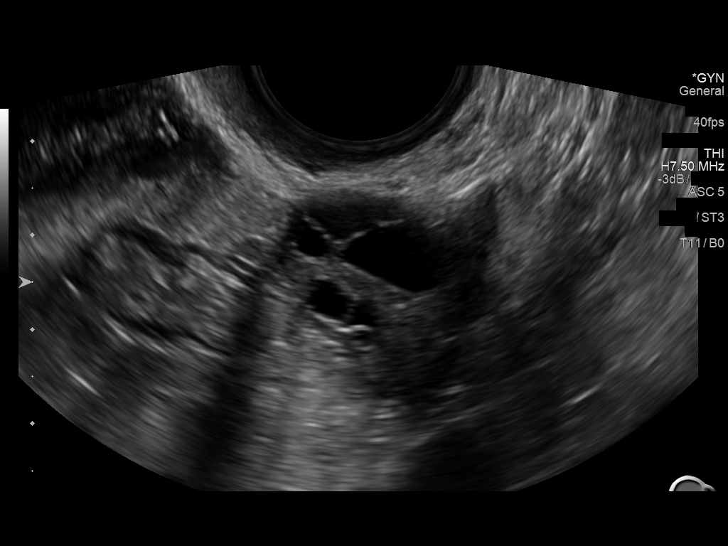
[im 66/79]
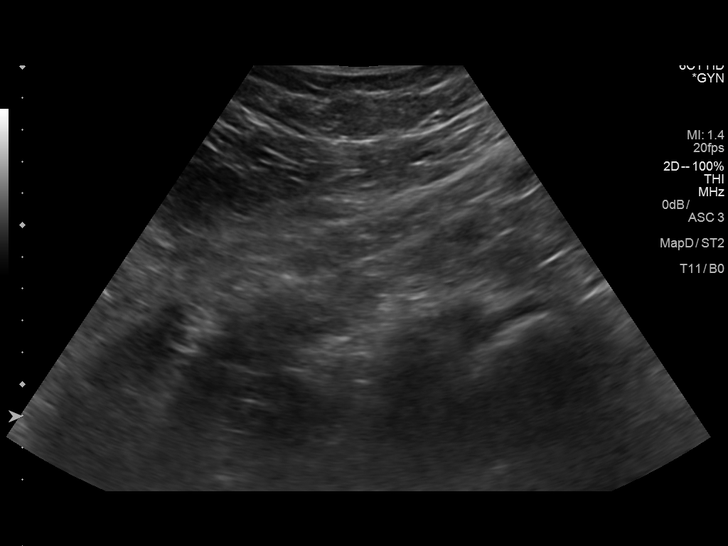
[im 72/79]
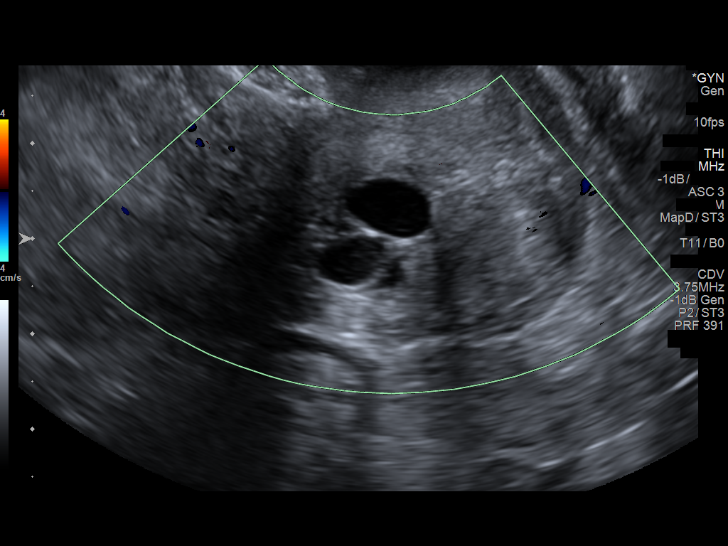
[im 79/79]
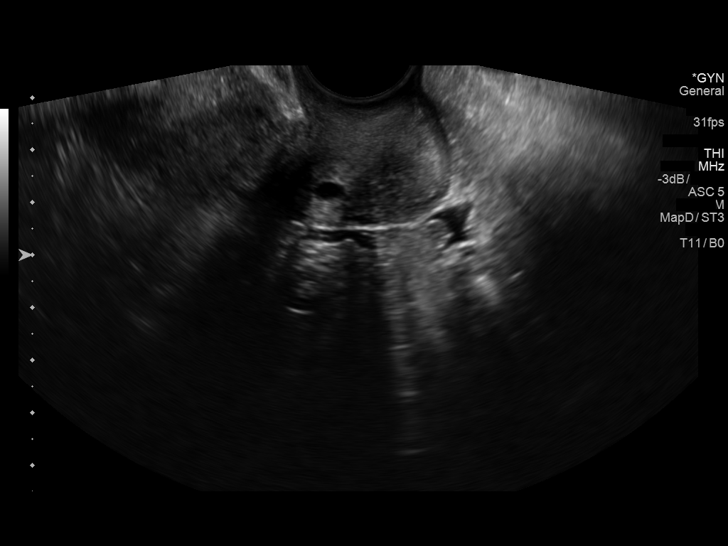

[14 of 25 positions shown; findings below may reference images not displayed]

FINDINGS: Uterus

Measurements: 7.0 x 3.6 x 4.6 cm, within normal limits. No fibroids
or other mass visualized.

Endometrium

Thickness: 9 mm, within normal limits. No focal abnormality
visualized.

Right ovary

Measurements: 3.0 x 2.3 x 3.2 cm, within normal limits. Normal
appearance/no adnexal mass.

Left ovary

Measurements: 3.0 x 1.9 x 2.3 cm, within limits. Normal
appearance/no adnexal mass.

Other findings

No abnormal free fluid.
IMPRESSION: Normal pelvic ultrasound.  No significant ovarian cystic disease.

## 2020-04-17 ENCOUNTER — Telehealth (INDEPENDENT_AMBULATORY_CARE_PROVIDER_SITE_OTHER): Payer: BC Managed Care – PPO | Admitting: Physician Assistant

## 2020-04-17 ENCOUNTER — Encounter: Payer: Self-pay | Admitting: Physician Assistant

## 2020-04-17 VITALS — Temp 98.6°F

## 2020-04-17 DIAGNOSIS — B9689 Other specified bacterial agents as the cause of diseases classified elsewhere: Secondary | ICD-10-CM | POA: Diagnosis not present

## 2020-04-17 DIAGNOSIS — H109 Unspecified conjunctivitis: Secondary | ICD-10-CM

## 2020-04-17 MED ORDER — POLYMYXIN B-TRIMETHOPRIM 10000-0.1 UNIT/ML-% OP SOLN: 1.0000 [drp] | OPHTHALMIC | 0 refills | Status: DC

## 2020-04-17 NOTE — Progress Notes (Addendum)
Patient ID: Crystal Bernard, female   DOB: 1991-04-05, 30 y.o.   MRN: [id number] .Marland KitchenVirtual Visit via Video call  I connected with Crystal Bernard on 04/17/20 at 11:30 AM EST by video call  and verified that I am speaking with the correct person using two identifiers.  Location: Patient: home Provider: home  .Marland KitchenParticipating in visit:  Patient: Crystal Bernard Provider: Tandy Gaw PA-C   I discussed the limitations, risks, security and privacy concerns of performing an evaluation and management service by telephone and the availability of in person appointments. I also discussed with the patient that there may be a patient responsible charge related to this service. The patient expressed understanding and agreed to proceed.   History of Present Illness: Pt is a 30 yo female with bilateral eye pain and redness. It started yesterday after friday night wearing glittery eye shadow and mascara. She was rubbing her eyes because she was tired. No fever, chills, body aches. Her left eye was first and more itchy but then moved to her right eye too and was more painful then. She has lots of green discharge and eyes were matted shut this morning. Not done anything to make better.   .. Active Ambulatory Problems    Diagnosis Date Noted  . Attention deficit hyperactivity disorder (ADHD), predominantly inattentive type 03/04/2016  . Right knee injury 05/03/2016  . Morbid obesity ([facility]) 03/21/2017  . Skin lesion of breast 09/14/2017  . Supervision of normal first pregnancy 01/26/2018  . Group B streptococcal bacteriuria 02/16/2018  . Low back strain, initial encounter 12/02/2018   Resolved Ambulatory Problems    Diagnosis Date Noted  . Abnormal weight gain 03/21/2017  . Secondary amenorrhea 11/19/2017  . Low serum follicle stimulating hormone [facility]) 11/19/2017   Past Medical History:  Diagnosis Date  . Amenorrhea    Reviewed med, allergy, problem list.      Observations/Objective: No acute distress Normal breathing No cough Bilateral erythematous conjunctiva with thick discharge reported and the beginnings of it forming in the lacrimal duct on video. Slightly swollen left upper and lower eyelid  .Marland Kitchen Active Ambulatory Problems    Diagnosis Date Noted  . Attention deficit hyperactivity disorder (ADHD), predominantly inattentive type 03/04/2016  . Right knee injury 05/03/2016  . Morbid obesity ([facility]) 03/21/2017  . Skin lesion of breast 09/14/2017  . Supervision of normal first pregnancy 01/26/2018  . Group B streptococcal bacteriuria 02/16/2018  . Low back strain, initial encounter 12/02/2018   Resolved Ambulatory Problems    Diagnosis Date Noted  . Abnormal weight gain 03/21/2017  . Secondary amenorrhea 11/19/2017  . Low serum follicle stimulating hormone [facility]) 11/19/2017   Past Medical History:  Diagnosis Date  . Amenorrhea       Assessment and Plan: Marland KitchenMarland KitchenDiagnoses and all orders for this visit:  Bacterial conjunctivitis of both eyes -     trimethoprim-polymyxin b (POLYTRIM) ophthalmic solution; Place 1 drop into both eyes every 4 (four) hours. For 7 days.   Due to eye pain and spreading from left to right sounds more bacterial than viral or allergic. Start polytrim for 7 days. Ok to go back to work 24 hours after start of abx drop. Warm compresses. Ok to take zyrtec or claritin if any allergy component for next 3 days.  flonase for nasal congestion and ear popping. Call if any worsening symptoms.     Follow Up Instructions:    I discussed the assessment and treatment plan with the patient. The patient was provided an  opportunity to ask questions and all were answered. The patient agreed with the plan and demonstrated an understanding of the instructions.   The patient was advised to call back or seek an in-person evaluation if the symptoms worsen or if the condition fails to improve as anticipated.   Tandy Gaw,  PA-C

## 2020-06-19 ENCOUNTER — Ambulatory Visit: Payer: BC Managed Care – PPO | Admitting: Physician Assistant

## 2020-06-19 DIAGNOSIS — Z1329 Encounter for screening for other suspected endocrine disorder: Secondary | ICD-10-CM

## 2020-06-19 DIAGNOSIS — Z131 Encounter for screening for diabetes mellitus: Secondary | ICD-10-CM

## 2020-06-19 DIAGNOSIS — Z13220 Encounter for screening for lipoid disorders: Secondary | ICD-10-CM

## 2020-06-19 DIAGNOSIS — F900 Attention-deficit hyperactivity disorder, predominantly inattentive type: Secondary | ICD-10-CM

## 2020-06-20 ENCOUNTER — Telehealth (INDEPENDENT_AMBULATORY_CARE_PROVIDER_SITE_OTHER): Payer: BC Managed Care – PPO | Admitting: Physician Assistant

## 2020-06-20 ENCOUNTER — Encounter: Payer: Self-pay | Admitting: Physician Assistant

## 2020-06-20 VITALS — Ht 65.0 in | Wt 220.0 lb

## 2020-06-20 DIAGNOSIS — Z30011 Encounter for initial prescription of contraceptive pills: Secondary | ICD-10-CM

## 2020-06-20 DIAGNOSIS — F900 Attention-deficit hyperactivity disorder, predominantly inattentive type: Secondary | ICD-10-CM | POA: Diagnosis not present

## 2020-06-20 MED ORDER — AMPHETAMINE-DEXTROAMPHETAMINE 10 MG PO TABS: 10.0000 mg | ORAL_TABLET | Freq: Two times a day (BID) | ORAL | 0 refills | Status: DC

## 2020-06-20 MED ORDER — NORETHIN ACE-ETH ESTRAD-FE 1-20 MG-MCG PO TABS: 1.0000 | ORAL_TABLET | Freq: Every day | ORAL | 3 refills | Status: DC

## 2020-06-20 NOTE — Progress Notes (Signed)
Diagnosed with ADHD in 1st grade Took Adderall in High School/College - stopped when she got pregnant and wants to discuss restarting

## 2020-06-20 NOTE — Progress Notes (Signed)
Patient ID: Crystal Bernard, female   DOB: 12/08/1990, 30 y.o.   MRN: [id number] .Marland KitchenVirtual Visit via Video Note  I connected with Crystal Bernard on 06/20/20 at  3:40 PM EST by a video enabled telemedicine application and verified that I am speaking with the correct person using two identifiers.  Location: Patient: home Provider: clinic  .Marland KitchenParticipating in visit:  Patient: Crystal Bernard Provider: Tandy Gaw PA-C   I discussed the limitations of evaluation and management by telemedicine and the availability of in person appointments. The patient expressed understanding and agreed to proceed.  History of Present Illness: Pt is a 30 yo female with ADHD who presents to the clinic to restart adderall. She stopped with her 2 pregnancies. She is done breastfeeding. She is needing her adderall for work performance.   She needs to start OCP as well. She has been using condoms for protection. She tolerated loestrin in the past.   .. Active Ambulatory Problems    Diagnosis Date Noted  . Attention deficit hyperactivity disorder (ADHD), predominantly inattentive type 03/04/2016  . Right knee injury 05/03/2016  . Morbid obesity ([facility]) 03/21/2017  . Skin lesion of breast 09/14/2017  . Supervision of normal first pregnancy 01/26/2018  . Group B streptococcal bacteriuria 02/16/2018  . Low back strain, initial encounter 12/02/2018  . Encounter for initial prescription of contraceptive pills 06/20/2020   Resolved Ambulatory Problems    Diagnosis Date Noted  . Abnormal weight gain 03/21/2017  . Secondary amenorrhea 11/19/2017  . Low serum follicle stimulating hormone [facility]) 11/19/2017   Past Medical History:  Diagnosis Date  . Amenorrhea    Reviewed med, allergy, problem list.    Observations/Objective: No acute distress Normal mood and appearance   Assessment and Plan: Marland KitchenMarland KitchenShelby-Anne was seen today for adhd.  Diagnoses and all orders for this visit:  Attention deficit  hyperactivity disorder (ADHD), predominantly inattentive type -     amphetamine-dextroamphetamine (ADDERALL) 10 MG tablet; Take 1 tablet (10 mg total) by mouth 2 (two) times daily with a meal. -     amphetamine-dextroamphetamine (ADDERALL) 10 MG tablet; Take 1 tablet (10 mg total) by mouth 2 (two) times daily. -     amphetamine-dextroamphetamine (ADDERALL) 10 MG tablet; Take 1 tablet (10 mg total) by mouth 2 (two) times daily.  Encounter for initial prescription of contraceptive pills -     norethindrone-ethinyl estradiol (LOESTRIN FE) 1-20 MG-MCG tablet; Take 1 tablet by mouth daily.   Restart adderall. Follow up in 3 months.   Restart loestrin after next cycle.   Follow Up Instructions:    I discussed the assessment and treatment plan with the patient. The patient was provided an opportunity to ask questions and all were answered. The patient agreed with the plan and demonstrated an understanding of the instructions.   The patient was advised to call back or seek an in-person evaluation if the symptoms worsen or if the condition fails to improve as anticipated.   Tandy Gaw, PA-C

## 2020-06-23 ENCOUNTER — Other Ambulatory Visit: Payer: Self-pay | Admitting: Physician Assistant

## 2020-06-23 MED ORDER — AMOXICILLIN-POT CLAVULANATE 875-125 MG PO TABS: 1.0000 | ORAL_TABLET | Freq: Two times a day (BID) | ORAL | 0 refills | Status: DC

## 2020-06-23 NOTE — Progress Notes (Signed)
Worsening sinusitis symptoms after allergies worsened last week. Lots of congestion, pressure, headache, coughing and blowing green sputum.

## 2020-07-18 IMAGING — US US MFM OB DETAIL+14 WK
1 series · 13 of 28 positions shown · non-contrast
Comparison: none

[Series 1: us mfm ob detail+14 wk · 13 of 69 slices shown]
[im 3/69]
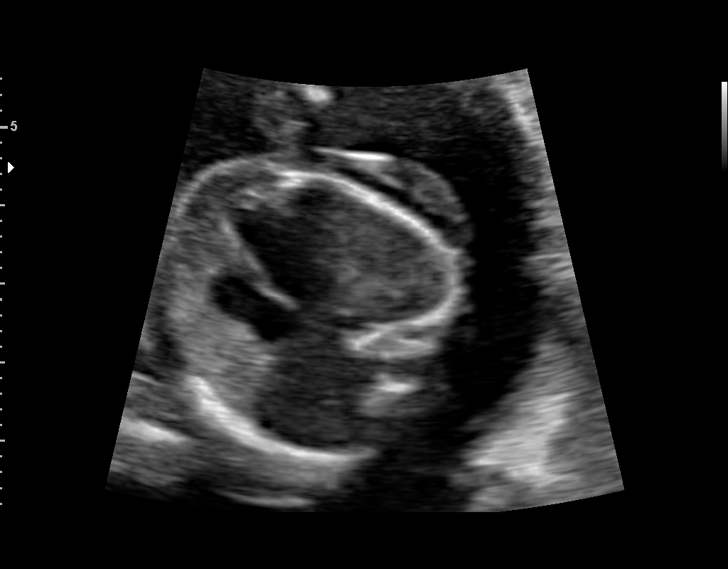
[im 8/69]
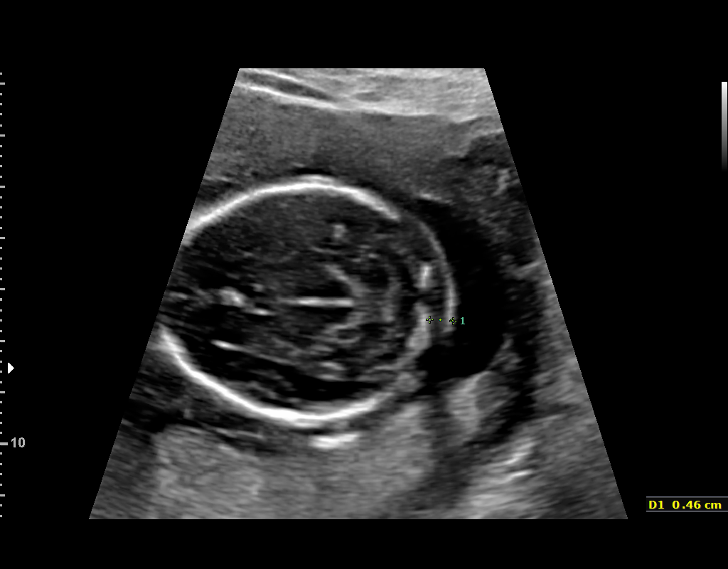
[im 13/69]
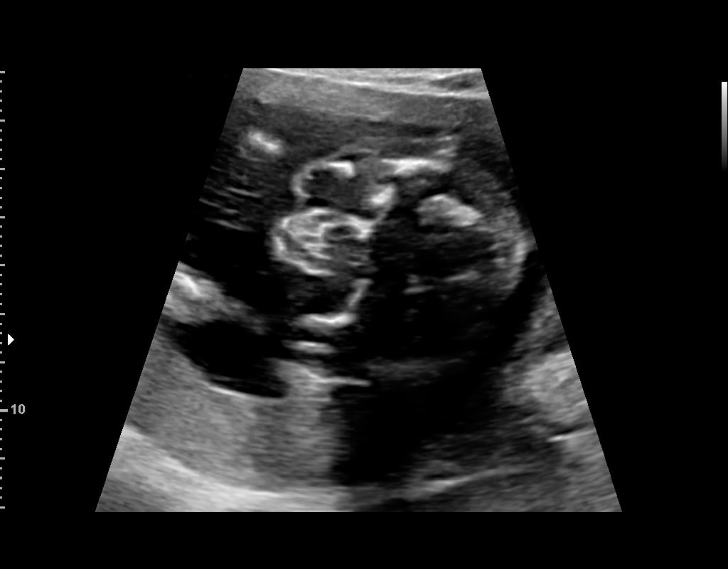
[im 18/69]
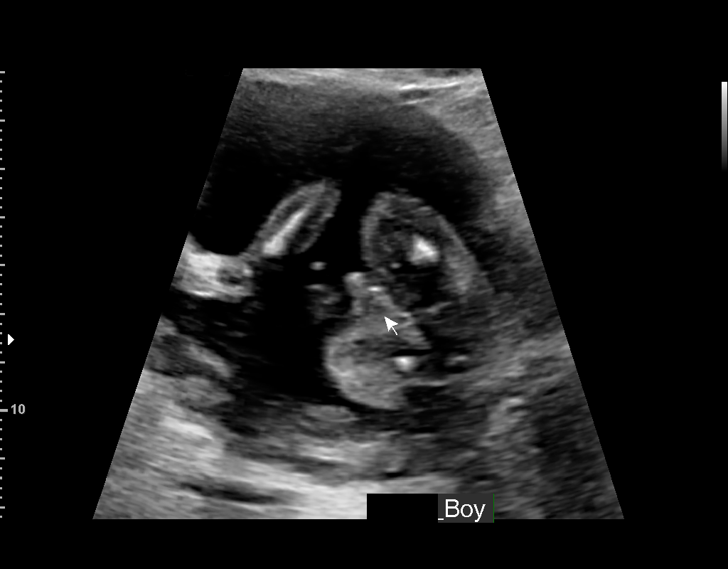
[im 23/69]
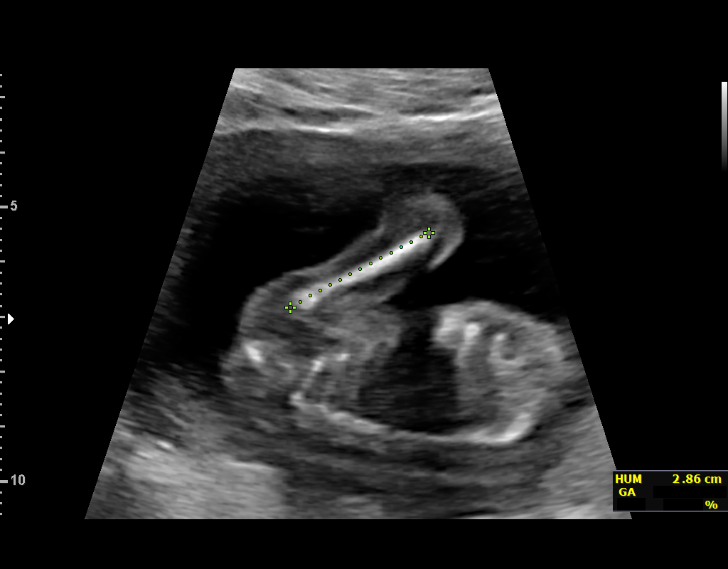
[im 28/69]
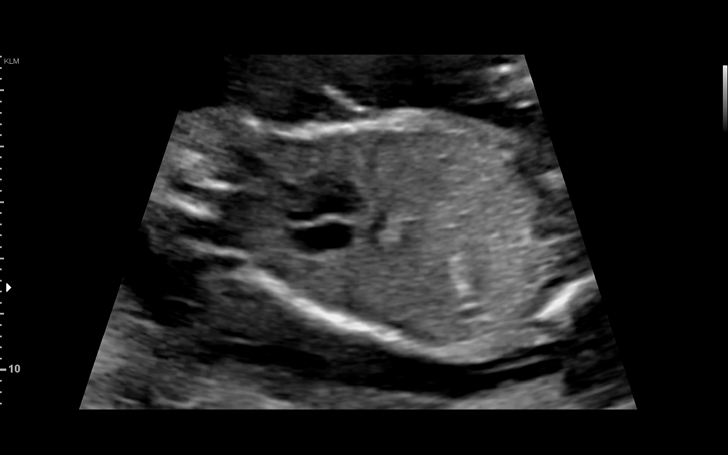
[im 36/69]
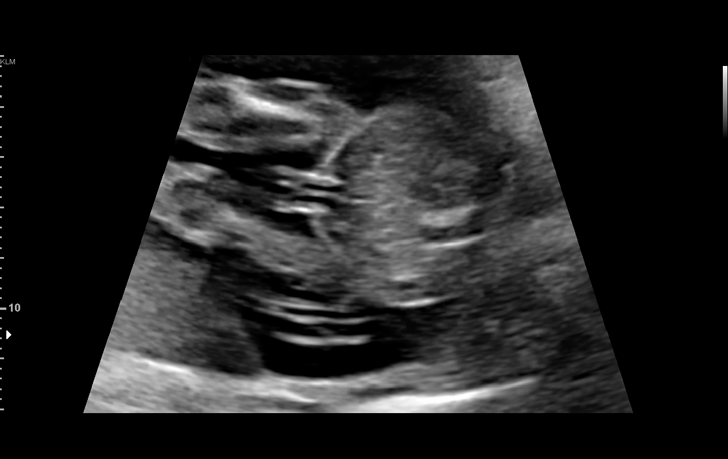
[im 41/69]
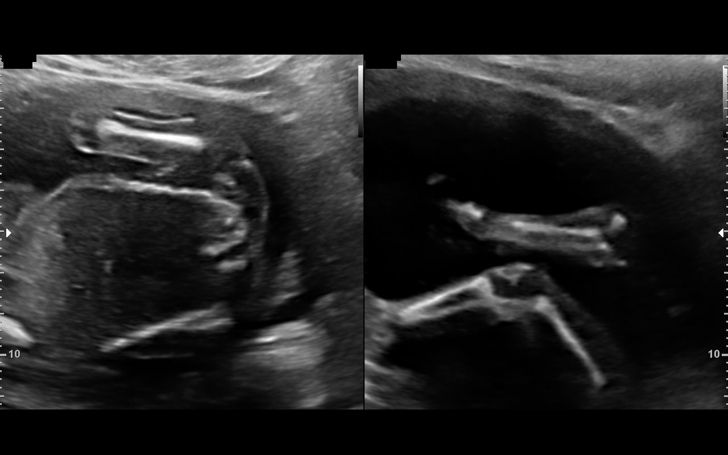
[im 46/69]
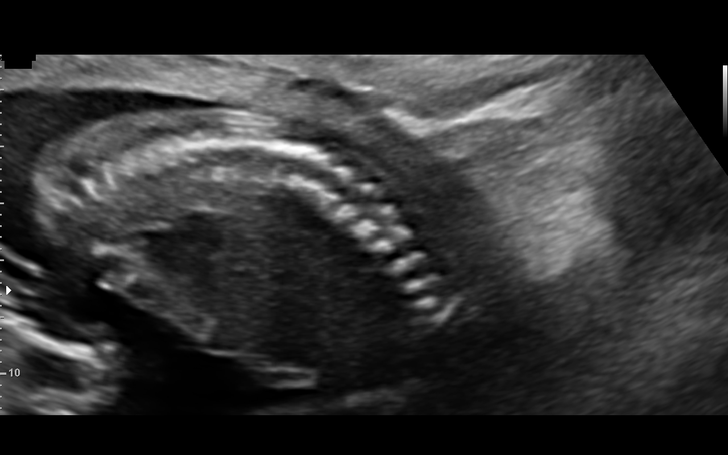
[im 51/69]
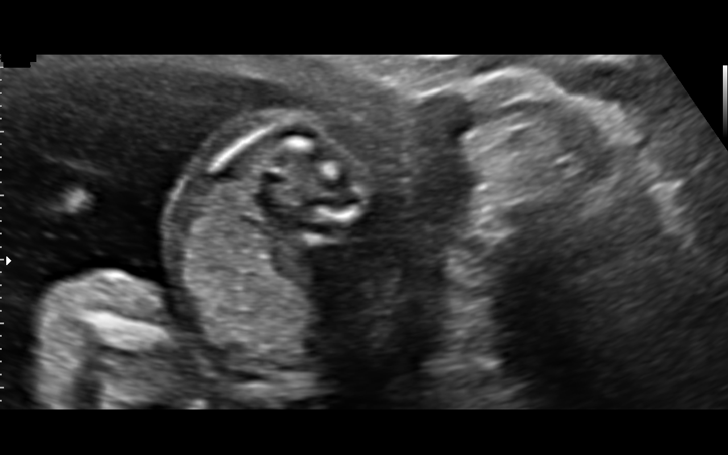
[im 56/69]
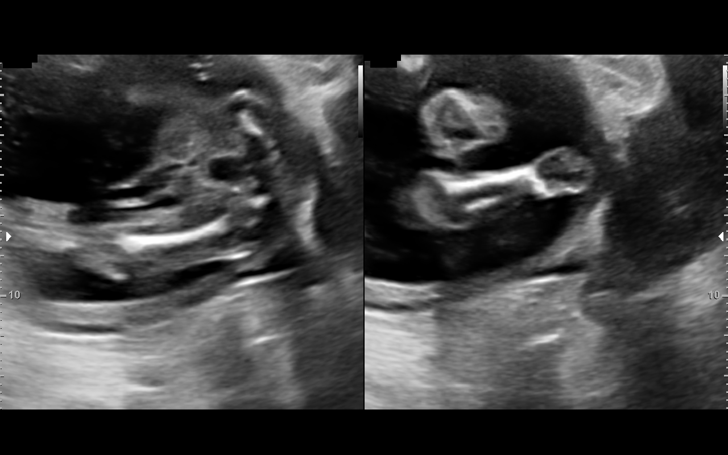
[im 61/69]
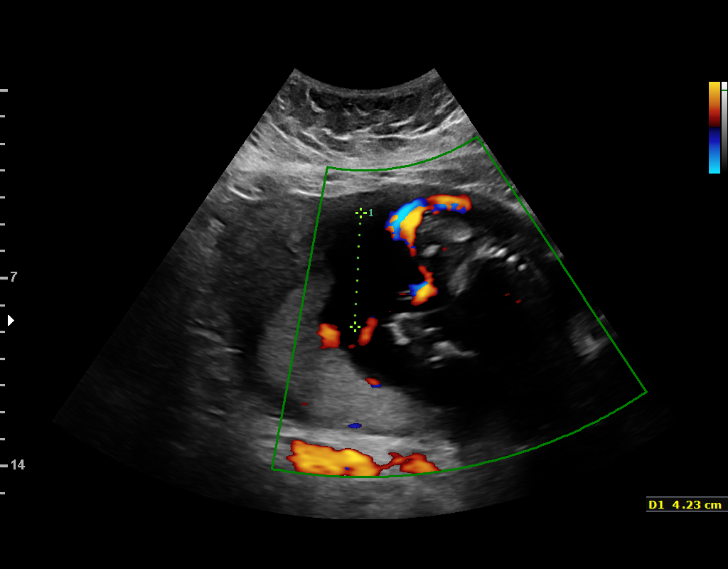
[im 66/69]
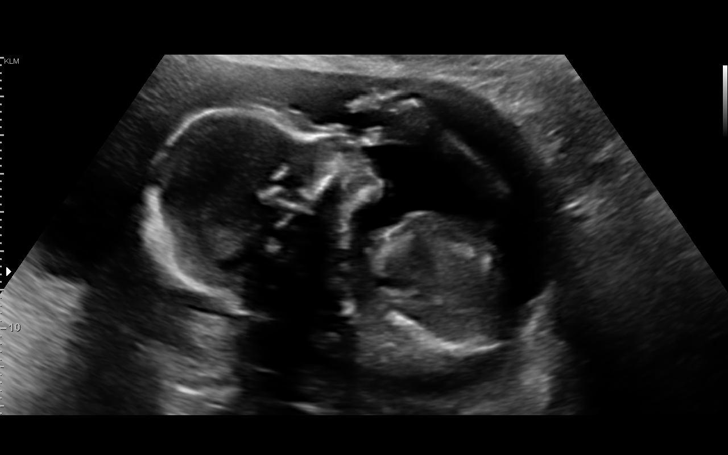

[13 of 28 positions shown; findings below may reference images not displayed]

for [REDACTED]care

 ----------------------------------------------------------------------

 ----------------------------------------------------------------------
Indications

  Obesity complicating pregnancy, second
  trimester
  19 weeks gestation of pregnancy
  Encounter for antenatal screening for
  malformations
 ----------------------------------------------------------------------
Fetal Evaluation

 Num Of Fetuses:          1
 Fetal Heart Rate(bpm):   148
 Cardiac Activity:        Observed
 Presentation:            Variable
 Placenta:                Posterior
 P. Cord Insertion:       Visualized

 Amniotic Fluid
 AFI FV:      Within normal limits

                             Largest Pocket(cm)

Biometry

 BPD:      44.1  mm     G. Age:  19w 2d         59  %    CI:        70.75   %    70 - 86
                                                         FL/HC:       16.6  %    16.1 -
 HC:      167.1  mm     G. Age:  19w 3d         54  %    HC/AC:       1.14       1.09 -
 AC:      147.2  mm     G. Age:  20w 0d         74  %    FL/BPD:      62.8  %
 FL:       27.7  mm     G. Age:  18w 3d         21  %    FL/AC:       18.8  %    20 - 24
 HUM:      28.6  mm     G. Age:  19w 2d         54  %

 Est. FW:     285   gm   0 lb 10 oz      48  %
OB History

 Gravidity:    1         Term:   0        Prem:   0        SAB:   0
 TOP:          0       Ectopic:  0        Living: 0
Gestational Age

 LMP:           19w 1d        Date:  11/16/17                 EDD:   08/23/18
 U/S Today:     19w 2d                                        EDD:   08/22/18
 Best:          19w 1d     Det. By:  LMP  (11/16/17)          EDD:   08/23/18
Anatomy

 Cranium:               Appears normal         Aortic Arch:            Not well visualized
 Cavum:                 Appears normal         Ductal Arch:            Not well visualized
 Ventricles:            Appears normal         Diaphragm:              Appears normal
 Choroid Plexus:        Appears normal         Stomach:                Appears normal, left
                                                                       sided
 Cerebellum:            Appears normal         Abdomen:                Appears normal
 Posterior Fossa:       Appears normal         Abdominal Wall:         Appears nml (cord
                                                                       insert, abd wall)
 Nuchal Fold:           Appears normal         Cord Vessels:           Appears normal (3
                                                                       vessel cord)
 Face:                  Appears normal         Kidneys:                Appear normal
                        (orbits and profile)
 Lips:                  Appears normal         Bladder:                Appears normal
 Thoracic:              Appears normal         Spine:                  Appears normal
 Heart:                 Appears normal         Upper Extremities:      Appears normal
                        (4CH, axis, and situs
 RVOT:                  Appears normal         Lower Extremities:      Appears normal
 LVOT:                  Appears normal

 Other:  Hands and feet not well visualized. Heels visualized. Fetus appears to
         be a male. Technically difficult due to maternal habitus and fetal
         position.
Cervix Uterus Adnexa

 Cervix
 Length:            3.6  cm.
 Normal appearance by transabdominal scan.

 Adnexa
 No abnormality visualized.
Impression

 Normal interval growth.  No ultrasonic evidence of structural
 fetal anomalies.
 Follow up in 4 weeks to complete the fetal anatomy.
Recommendations

 Follow up in 4 weeks.

## 2020-07-25 ENCOUNTER — Other Ambulatory Visit: Payer: Self-pay | Admitting: Physician Assistant

## 2020-07-25 MED ORDER — ETONOGESTREL-ETHINYL ESTRADIOL 0.12-0.015 MG/24HR VA RING: VAGINAL_RING | VAGINAL | 4 refills | Status: DC

## 2020-07-25 NOTE — Progress Notes (Signed)
Oral OCP causing mood swings. Desires trial of nuva ring.

## 2020-09-07 ENCOUNTER — Other Ambulatory Visit: Payer: Self-pay | Admitting: Physician Assistant

## 2020-10-13 ENCOUNTER — Other Ambulatory Visit: Payer: Self-pay | Admitting: Physician Assistant

## 2020-10-13 MED ORDER — AZITHROMYCIN 250 MG PO TABS: ORAL_TABLET | ORAL | 0 refills | Status: DC

## 2020-10-13 NOTE — Progress Notes (Signed)
Called in after hours.  URI symptoms for 1 week.  No fever, chills, body aches.  Negative covid x2.  Headache, sinus pressure, fatigue, ST.   Discussed symptomatic care. Sent zpak.

## 2020-12-28 ENCOUNTER — Other Ambulatory Visit: Payer: Self-pay | Admitting: Physician Assistant

## 2020-12-28 DIAGNOSIS — H9201 Otalgia, right ear: Secondary | ICD-10-CM

## 2020-12-28 MED ORDER — OFLOXACIN 0.3 % OT SOLN: 10.0000 [drp] | Freq: Every day | OTIC | 0 refills | Status: DC

## 2020-12-28 NOTE — Progress Notes (Signed)
Left external ear pain and swelling and draining.  Sent abx drops for external otitis.

## 2021-01-09 ENCOUNTER — Encounter: Payer: Self-pay | Admitting: Physician Assistant

## 2021-01-09 ENCOUNTER — Ambulatory Visit (INDEPENDENT_AMBULATORY_CARE_PROVIDER_SITE_OTHER): Payer: BC Managed Care – PPO | Admitting: Physician Assistant

## 2021-01-09 ENCOUNTER — Other Ambulatory Visit: Payer: Self-pay

## 2021-01-09 VITALS — BP 96/63 | HR 92 | Ht 65.0 in | Wt 214.0 lb

## 2021-01-09 DIAGNOSIS — Z3044 Encounter for surveillance of vaginal ring hormonal contraceptive device: Secondary | ICD-10-CM | POA: Diagnosis not present

## 2021-01-09 DIAGNOSIS — F900 Attention-deficit hyperactivity disorder, predominantly inattentive type: Secondary | ICD-10-CM | POA: Diagnosis not present

## 2021-01-09 MED ORDER — AMPHETAMINE-DEXTROAMPHETAMINE 10 MG PO TABS: 10.0000 mg | ORAL_TABLET | Freq: Two times a day (BID) | ORAL | 0 refills | Status: DC

## 2021-01-09 MED ORDER — ETONOGESTREL-ETHINYL ESTRADIOL 0.12-0.015 MG/24HR VA RING: VAGINAL_RING | VAGINAL | 4 refills | Status: DC

## 2021-01-09 NOTE — Progress Notes (Signed)
   Subjective:    Patient ID: Crystal Bernard, female    DOB: 08-11-1990, 30 y.o.   MRN: [id number]  HPI Pt is a 29 yo female with ADHD who needs refills.   She is doing great on current dose. No problems sleeping, anxious thoughts, palpitations, headaches or vision changes.   She does need refill on nuva ring. Pap done less than 2 years ago with her last daughter at [facility].     .. Active Ambulatory Problems    Diagnosis Date Noted   Attention deficit hyperactivity disorder (ADHD), predominantly inattentive type 03/04/2016   Right knee injury 05/03/2016   Morbid obesity (HCC) 03/21/2017   Skin lesion of breast 09/14/2017   Supervision of normal first pregnancy 01/26/2018   Group B streptococcal bacteriuria 02/16/2018   Low back strain, initial encounter 12/02/2018   Encounter for initial prescription of contraceptive pills 06/20/2020   Encounter for surveillance of vaginal ring hormonal contraceptive device 01/09/2021   Resolved Ambulatory Problems    Diagnosis Date Noted   Abnormal weight gain 03/21/2017   Secondary amenorrhea 11/19/2017   Low serum follicle stimulating hormone [facility]) 11/19/2017   Past Medical History:  Diagnosis Date   Amenorrhea      Review of Systems  All other systems reviewed and are negative.     Objective:   Physical Exam Vitals reviewed.  Constitutional:      Appearance: Normal appearance.  HENT:     Head: Normocephalic.  Cardiovascular:     Rate and Rhythm: Normal rate and regular rhythm.     Pulses: Normal pulses.     Heart sounds: Normal heart sounds.  Pulmonary:     Effort: Pulmonary effort is normal.     Breath sounds: Normal breath sounds.  Neurological:     General: No focal deficit present.     Mental Status: She is alert and oriented to person, place, and time.  Psychiatric:        Mood and Affect: Mood normal.          Assessment & Plan:  Marland KitchenMarland KitchenShelby-Anne was seen today for adhd.  Diagnoses and all orders for  this visit:  Attention deficit hyperactivity disorder (ADHD), predominantly inattentive type -     amphetamine-dextroamphetamine (ADDERALL) 10 MG tablet; Take 1 tablet (10 mg total) by mouth 2 (two) times daily with a meal. -     amphetamine-dextroamphetamine (ADDERALL) 10 MG tablet; Take 1 tablet (10 mg total) by mouth 2 (two) times daily. -     amphetamine-dextroamphetamine (ADDERALL) 10 MG tablet; Take 1 tablet (10 mg total) by mouth 2 (two) times daily.  Encounter for surveillance of vaginal ring hormonal contraceptive device -     etonogestrel-ethinyl estradiol (NUVARING) 0.12-0.015 MG/24HR vaginal ring; Insert vaginally and leave in place for 3 consecutive weeks, then remove for 1 week.   Refilled Adderall for 3 months but will send over next 3 months when calls.  Follow up 6 months.   Pap UTD. Will call and get records. Nuvaring refilled.

## 2021-01-12 ENCOUNTER — Other Ambulatory Visit: Payer: Self-pay | Admitting: Physician Assistant

## 2021-01-12 MED ORDER — AMOXICILLIN-POT CLAVULANATE 875-125 MG PO TABS: 1.0000 | ORAL_TABLET | Freq: Two times a day (BID) | ORAL | 0 refills | Status: AC

## 2021-01-30 DIAGNOSIS — Z87898 Personal history of other specified conditions: Secondary | ICD-10-CM | POA: Diagnosis not present

## 2021-01-30 DIAGNOSIS — L98499 Non-pressure chronic ulcer of skin of other sites with unspecified severity: Secondary | ICD-10-CM | POA: Diagnosis not present

## 2021-02-14 ENCOUNTER — Other Ambulatory Visit: Payer: Self-pay | Admitting: Physician Assistant

## 2021-02-14 MED ORDER — DOXYCYCLINE HYCLATE 100 MG PO TABS: 100.0000 mg | ORAL_TABLET | Freq: Two times a day (BID) | ORAL | 0 refills | Status: DC

## 2021-02-14 NOTE — Progress Notes (Signed)
Right axilla abscess that is not draining but pain and redness is extending down her right tricep. She does have a low grade fever 99 to 100.  Warm compresses, ibuprofen, doxycycline sent.

## 2021-03-02 DIAGNOSIS — Z87898 Personal history of other specified conditions: Secondary | ICD-10-CM | POA: Diagnosis not present

## 2021-03-02 DIAGNOSIS — L905 Scar conditions and fibrosis of skin: Secondary | ICD-10-CM | POA: Diagnosis not present

## 2021-03-28 ENCOUNTER — Encounter: Payer: Self-pay | Admitting: Physician Assistant

## 2021-03-28 ENCOUNTER — Other Ambulatory Visit: Payer: Self-pay

## 2021-03-28 ENCOUNTER — Ambulatory Visit: Payer: BC Managed Care – PPO | Admitting: Physician Assistant

## 2021-03-28 VITALS — BP 124/74 | HR 88 | Temp 98.6°F | Resp 18 | Wt 210.0 lb

## 2021-03-28 DIAGNOSIS — R52 Pain, unspecified: Secondary | ICD-10-CM

## 2021-03-28 DIAGNOSIS — J029 Acute pharyngitis, unspecified: Secondary | ICD-10-CM | POA: Diagnosis not present

## 2021-03-28 DIAGNOSIS — R509 Fever, unspecified: Secondary | ICD-10-CM | POA: Diagnosis not present

## 2021-03-28 LAB — POCT RAPID STREP A (OFFICE): Rapid Strep A Screen: NEGATIVE

## 2021-03-28 LAB — POCT INFLUENZA A/B
Influenza A, POC: NEGATIVE
Influenza B, POC: NEGATIVE

## 2021-03-28 MED ORDER — AMOXICILLIN-POT CLAVULANATE 875-125 MG PO TABS: 1.0000 | ORAL_TABLET | Freq: Two times a day (BID) | ORAL | 0 refills | Status: AC

## 2021-03-28 NOTE — Progress Notes (Addendum)
Subjective:    Patient ID: Crystal Bernard, female    DOB: 1990-08-09, 30 y.o.   MRN: [id number]  HPI 30 y.o female presenting to the clinic with one day of sore throat and fever. Pt states that it feels like the strep throat she had in the past. Pain is worse with swallowing and drinking. States that she did not take her temperature at home but did have episodes of sweating and chills. Has small children at home and states that one had classmates that tested positive for strep throat last week. Reports some congestion and aural fullness but denies cough, shortness of breath, wheezing, sinus pain/pressure, or rash.    Review of Systems  Constitutional:  Positive for chills, diaphoresis and fever. Negative for fatigue.  HENT:  Positive for congestion and sore throat. Negative for ear pain, facial swelling, postnasal drip, rhinorrhea, sinus pressure, sinus pain and sneezing.   Respiratory:  Negative for cough, chest tightness, shortness of breath and wheezing.   Cardiovascular:  Negative for chest pain and palpitations.  Gastrointestinal:  Negative for abdominal pain.  Musculoskeletal:  Negative for arthralgias and myalgias.  Skin:  Negative for pallor.  Neurological:  Negative for headaches.      Objective:   Physical Exam Constitutional:      General: She is not in acute distress.    Appearance: Normal appearance.  HENT:     Head: Normocephalic and atraumatic.     Right Ear: Tympanic membrane and ear canal normal.     Left Ear: Tympanic membrane and ear canal normal.     Nose: Congestion present.     Mouth/Throat:     Mouth: Mucous membranes are moist.     Pharynx: Posterior oropharyngeal erythema present. No oropharyngeal exudate.  Eyes:     Extraocular Movements: Extraocular movements intact.     Conjunctiva/sclera: Conjunctivae normal.  Cardiovascular:     Rate and Rhythm: Normal rate and regular rhythm.     Pulses: Normal pulses.     Heart sounds: Normal heart sounds.   Pulmonary:     Effort: Pulmonary effort is normal.     Breath sounds: Normal breath sounds.  Lymphadenopathy:     Cervical: No cervical adenopathy.  Skin:    Coloration: Skin is not pale.     Findings: No rash.  Neurological:     Mental Status: She is alert and oriented to person, place, and time.  Psychiatric:        Mood and Affect: Mood normal.        Behavior: Behavior normal.   .. Results for orders placed or performed in visit on 03/28/21  POCT rapid strep A  Result Value Ref Range   Rapid Strep A Screen Negative Negative  POCT Influenza A/B  Result Value Ref Range   Influenza A, POC Negative Negative   Influenza B, POC Negative Negative          Assessment & Plan:  Diagnoses and all orders for this visit: Acute pharyngitis, unspecified etiology -     POCT rapid strep A -     POCT Influenza A/B -     Novel Coronavirus, NAA ([facility]) Other orders -     amoxicillin-clavulanate (AUGMENTIN) 875-125 MG tablet; Take 1 tablet by mouth 2 (two) times daily for 10 days.   -Rapid strep test negative at today's visit but pt states that she has tested negative early on for strep in the past and was positive on later retests. Has chills,  sweats, absence of cough, sore throat, and posterior oropharynx erythema with possible exposure to strep.  -Tested pt for flu and COVID.Flu negative.  Pt to begin Augmentin if results are negative. -Pt to F/U if symptoms worsen or fail to improve   .Harlon Flor PA-C, have reviewed and agree with the above documentation in it's entirety.

## 2021-03-28 NOTE — Patient Instructions (Signed)
Sore Throat ?A sore throat is pain, burning, irritation, or scratchiness in the throat. When you have a sore throat, you may feel pain or tenderness in your throat when you swallow or talk. ?Many things can cause a sore throat, including: ?An infection. ?Seasonal allergies. ?Dryness in the air. ?Irritants, such as smoke or pollution. ?Radiation treatment for cancer. ?Gastroesophageal reflux disease (GERD). ?A tumor. ?A sore throat is often the first sign of another sickness. It may happen with other symptoms, such as coughing, sneezing, fever, and swollen neck glands. Most sore throats go away without medical treatment. ?Follow these instructions at home: ?  ?Medicines ?Take over-the-counter and prescription medicines only as told by your health care provider. ?Children often get sore throats. Do not give your child aspirin because of the association with Reye's syndrome. ?Use throat sprays to soothe your throat as told by your health care provider. ?Managing pain ?To help with pain, try: ?Sipping warm liquids, such as broth, herbal tea, or warm water. ?Eating or drinking cold or frozen liquids, such as frozen ice pops. ?Gargling with a mixture of salt and water 3-4 times a day or as needed. To make salt water, completely dissolve ?-1 tsp (3-6 g) of salt in 1 cup (237 mL) of warm water. ?Sucking on hard candy or throat lozenges. ?Putting a cool-mist humidifier in your bedroom at night to moisten the air. ?Sitting in the bathroom with the door closed for 5-10 minutes while you run hot water in the shower. ?General instructions ?Do not use any products that contain nicotine or tobacco. These products include cigarettes, chewing tobacco, and vaping devices, such as e-cigarettes. If you need help quitting, ask your health care provider. ?Rest as needed. ?Drink enough fluid to keep your urine pale yellow. ?Wash your hands often with soap and water for at least 20 seconds. If soap and water are not available, use hand  sanitizer. ?Contact a health care provider if: ?You have a fever for more than 2-3 days. ?You have symptoms that last for more than 2-3 days. ?Your throat does not get better within 7 days. ?You have a fever and your symptoms suddenly get worse. ?Get help right away if: ?You have difficulty breathing. ?You cannot swallow fluids, soft foods, or your saliva. ?You have increased swelling in your throat or neck. ?You have persistent nausea and vomiting. ?These symptoms may represent a serious problem that is an emergency. Do not wait to see if the symptoms will go away. Get medical help right away. Call your local emergency services (911 in the [place] Do not drive yourself to the hospital. ?Summary ?A sore throat is pain, burning, irritation, or scratchiness in the throat. Many things can cause a sore throat. ?Take over-the-counter medicines only as told by your health care provider. ?Rest as needed. ?Drink enough fluid to keep your urine pale yellow. ?Contact a health care provider if your throat does not get better within 7 days. ?This information is not intended to replace advice given to you by your health care provider. Make sure you discuss any questions you have with your health care provider. ?Document Revised: 06/28/2020 Document Reviewed: 06/28/2020 ?Elsevier Patient Education ? 2022 Elsevier Inc. ? ?

## 2021-03-29 LAB — NOVEL CORONAVIRUS, NAA: SARS-CoV-2, NAA: NOT DETECTED

## 2021-03-29 LAB — SARS-COV-2, NAA 2 DAY TAT

## 2021-03-30 NOTE — Progress Notes (Signed)
Negative for covid

## 2021-06-13 ENCOUNTER — Other Ambulatory Visit: Payer: Self-pay | Admitting: Physician Assistant

## 2021-06-13 DIAGNOSIS — F900 Attention-deficit hyperactivity disorder, predominantly inattentive type: Secondary | ICD-10-CM

## 2021-06-13 MED ORDER — AMPHETAMINE-DEXTROAMPHETAMINE 10 MG PO TABS: 10.0000 mg | ORAL_TABLET | Freq: Two times a day (BID) | ORAL | 0 refills | Status: DC

## 2021-06-13 NOTE — Progress Notes (Signed)
Refilled adderall.

## 2021-07-02 ENCOUNTER — Ambulatory Visit (INDEPENDENT_AMBULATORY_CARE_PROVIDER_SITE_OTHER): Payer: BC Managed Care – PPO | Admitting: Professional

## 2021-07-02 ENCOUNTER — Encounter: Payer: Self-pay | Admitting: Professional

## 2021-07-02 DIAGNOSIS — F900 Attention-deficit hyperactivity disorder, predominantly inattentive type: Secondary | ICD-10-CM

## 2021-07-02 DIAGNOSIS — F4322 Adjustment disorder with anxiety: Secondary | ICD-10-CM | POA: Insufficient documentation

## 2021-07-02 NOTE — Progress Notes (Signed)
° ° ° ° ° ° ° ° ° ° ° ° ° ° °  Crystina Borrayo, LCMHC °
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
## 2021-07-03 ENCOUNTER — Telehealth (INDEPENDENT_AMBULATORY_CARE_PROVIDER_SITE_OTHER): Payer: BC Managed Care – PPO | Admitting: Physician Assistant

## 2021-07-03 ENCOUNTER — Encounter: Payer: Self-pay | Admitting: Physician Assistant

## 2021-07-03 VITALS — Ht 65.0 in | Wt 210.0 lb

## 2021-07-03 DIAGNOSIS — M545 Low back pain, unspecified: Secondary | ICD-10-CM

## 2021-07-03 DIAGNOSIS — E6609 Other obesity due to excess calories: Secondary | ICD-10-CM

## 2021-07-03 DIAGNOSIS — Z6834 Body mass index (BMI) 34.0-34.9, adult: Secondary | ICD-10-CM

## 2021-07-03 DIAGNOSIS — Z1329 Encounter for screening for other suspected endocrine disorder: Secondary | ICD-10-CM

## 2021-07-03 DIAGNOSIS — Z13220 Encounter for screening for lipoid disorders: Secondary | ICD-10-CM

## 2021-07-03 DIAGNOSIS — F900 Attention-deficit hyperactivity disorder, predominantly inattentive type: Secondary | ICD-10-CM

## 2021-07-03 DIAGNOSIS — Z79899 Other long term (current) drug therapy: Secondary | ICD-10-CM

## 2021-07-03 DIAGNOSIS — Z131 Encounter for screening for diabetes mellitus: Secondary | ICD-10-CM

## 2021-07-03 MED ORDER — WEGOVY 1 MG/0.5ML ~~LOC~~ SOAJ: 1.0000 mg | SUBCUTANEOUS | 0 refills | Status: DC

## 2021-07-03 MED ORDER — WEGOVY 0.5 MG/0.5ML ~~LOC~~ SOAJ: 0.5000 mg | SUBCUTANEOUS | 0 refills | Status: DC

## 2021-07-03 MED ORDER — WEGOVY 0.25 MG/0.5ML ~~LOC~~ SOAJ
0.2500 mg | SUBCUTANEOUS | 0 refills | Status: DC
Start: 2021-07-03 — End: 2021-09-21

## 2021-07-03 NOTE — Progress Notes (Signed)
..Virtual Visit via Video Note ? ?I connected with Crystal Bernard on 07/03/21 at  1:20 PM EDT by a video enabled telemedicine application and verified that I am speaking with the correct person using two identifiers. ? ?Location: ?Patient: work ?Provider: clinic ? ?Marland Kitchen.Participating in visit:  ?Patient: Crystal Bernard ?Provider: Tandy Gaw PA-C ?Provider in training: Joyice Faster PA-S ?  ?I discussed the limitations of evaluation and management by telemedicine and the availability of in person appointments. The patient expressed understanding and agreed to proceed. ? ?History of Present Illness: ?Pt is a 31 yo obese female with ADD who calls in to discuss weight loss options and low back pain intermittently.  ? ?Pt has low back pain more left than right intermittently but worse around cycle. No radicular pain. No lower ext strength changes. No injury.worse with bending and lifting. She has 2 young kids at home. Ibuprofen/heat/rest help some.  ?.. ?Active Ambulatory Problems  ?  Diagnosis Date Noted  ? Attention deficit hyperactivity disorder (ADHD), predominantly inattentive type 03/04/2016  ? Right knee injury 05/03/2016  ? Class 1 obesity due to excess calories without serious comorbidity with body mass index (BMI) of 34.0 to 34.9 in adult 03/21/2017  ? Skin lesion of breast 09/14/2017  ? Supervision of normal first pregnancy 01/26/2018  ? Group B streptococcal bacteriuria 02/16/2018  ? Low back strain, initial encounter 12/02/2018  ? Encounter for initial prescription of contraceptive pills 06/20/2020  ? Encounter for surveillance of vaginal ring hormonal contraceptive device 01/09/2021  ? Adjustment disorder with anxiety 07/02/2021  ? Acute bilateral low back pain without sciatica 07/03/2021  ? ?Resolved Ambulatory Problems  ?  Diagnosis Date Noted  ? Abnormal weight gain 03/21/2017  ? Secondary amenorrhea 11/19/2017  ? Low serum follicle stimulating hormone (FSH) 11/19/2017  ? ?Past Medical History:  ?Diagnosis  Date  ? Amenorrhea   ? ? ?  ?Observations/Objective: ?No acute distress ?Normal mood and appearance ? ?.. ?Today's Vitals  ? 07/03/21 1316  ?Weight: 210 lb (95.3 kg)  ?Height: 5\' 5"  (1.651 m)  ? ?Body mass index is 34.95 kg/m?. ? ?Assessment and Plan: ?..Crystal Bernard was seen today for weight check. ? ?Diagnoses and all orders for this visit: ? ?Class 1 obesity due to excess calories without serious comorbidity with body mass index (BMI) of 34.0 to 34.9 in adult ?-     Semaglutide-Weight Management (WEGOVY) 0.25 MG/0.5ML SOAJ; Inject 0.25 mg into the skin once a week. ?-     WEGOVY 0.5 MG/0.5ML SOAJ; Inject 0.5 mg into the skin once a week. Use this dose for 1 month (4 shots) and then increase to next higher dose. ?-     WEGOVY 1 MG/0.5ML SOAJ; Inject 1 mg into the skin once a week. Use this dose for 1 month (4 shots) and then increase to next higher dose. ? ?Lipid screening ?-     Lipid Panel w/reflex Direct LDL ? ?Diabetes mellitus screening ?-     COMPLETE METABOLIC PANEL WITH GFR ? ?Thyroid disorder screen ?-     TSH ? ?Medication management ?-     TSH ?-     Lipid Panel w/reflex Direct LDL ?-     COMPLETE METABOLIC PANEL WITH GFR ?-     CBC with Differential/Platelet ? ?Attention deficit hyperactivity disorder (ADHD), predominantly inattentive type ? ?Acute bilateral low back pain without sciatica ? ? ?Adderall just refilled for 3 months.  ? ?Discussed low back pain and management with good lifting, NSAIDs prn, tens  unit, core work out. Follow up as needed.  ?Needs fasting labs.  ?..Discussed low carb diet with 1500 calories and 80g of protein.  ?Exercising at least 150 minutes a week.  ?[employer] could be a Chief Technology Officer.  ?Discussed options ?Agreed to wegovy ?Discussed side effects and titration up ?Follow up in 3 months.  ? ? ?Follow Up Instructions: ? ?  ?I discussed the assessment and treatment plan with the patient. The patient was provided an opportunity to ask questions and all were answered. The  patient agreed with the plan and demonstrated an understanding of the instructions. ?  ?The patient was advised to call back or seek an in-person evaluation if the symptoms worsen or if the condition fails to improve as anticipated. ? ? ? ?Tandy Gaw, PA-C ? ?

## 2021-07-03 NOTE — Progress Notes (Signed)
Back pain status post kids, back is "out" today (started Friday) ?Using ibuprofen, heat, took muscle relaxer that was prescribed years ago ?Gets sensitive around menstrual cycle ? ?Wants to discuss weight loss meds, never been on them before ?

## 2021-07-04 NOTE — Addendum Note (Signed)
Addended by: Ardyth Man on: 07/04/2021 10:52 AM ? ? Modules accepted: Level of Service ? ?

## 2021-07-12 ENCOUNTER — Telehealth: Payer: Self-pay

## 2021-07-12 ENCOUNTER — Ambulatory Visit: Payer: BC Managed Care – PPO | Admitting: Professional

## 2021-07-12 NOTE — Telephone Encounter (Addendum)
Initiated Prior authorization [id number] 0.25MG /0.5ML auto-injectors ?Via: Covermymeds ?Case/Key: [id number] ?Status: approved as of 07/12/21 ?Reason: Effective from 07/13/2021 through 11/15/2021 ?Notified Pt via: Mychart ?

## 2021-07-16 ENCOUNTER — Telehealth: Payer: Self-pay

## 2021-07-16 NOTE — Telephone Encounter (Signed)
Pt has received an approval on  ?ZOXWRU 0.5MG /0.5ML auto-injectors ? Pt has been notified via mychart ?

## 2021-07-19 ENCOUNTER — Ambulatory Visit: Payer: BC Managed Care – PPO | Admitting: Professional

## 2021-07-27 ENCOUNTER — Ambulatory Visit: Payer: BC Managed Care – PPO | Admitting: Professional

## 2021-07-28 ENCOUNTER — Encounter: Payer: Self-pay | Admitting: Professional

## 2021-07-28 ENCOUNTER — Ambulatory Visit (INDEPENDENT_AMBULATORY_CARE_PROVIDER_SITE_OTHER): Payer: BC Managed Care – PPO | Admitting: Professional

## 2021-07-28 DIAGNOSIS — F900 Attention-deficit hyperactivity disorder, predominantly inattentive type: Secondary | ICD-10-CM

## 2021-07-28 DIAGNOSIS — F418 Other specified anxiety disorders: Secondary | ICD-10-CM | POA: Insufficient documentation

## 2021-07-28 DIAGNOSIS — O99345 Other mental disorders complicating the puerperium: Secondary | ICD-10-CM | POA: Diagnosis not present

## 2021-07-28 NOTE — Progress Notes (Signed)
° ° ° ° ° ° ° ° ° ° ° ° ° ° °  Lyzbeth Genrich, LCMHC °

## 2021-07-28 NOTE — Progress Notes (Addendum)
[facility] Counselor/Therapist Progress Note ? ?Patient ID: Crystal Bernard, MRN: [id number],   ? ?Date: 07/28/2021 ? ?Time Spent: 53 minutes 9-953am  ? ?Treatment Type: Individual Therapy ? ?Risk Assessment: ?Danger to Self:  No ?Self-injurious Behavior: No ?Danger to Others: No ? ?Subjective: This session was held via video teletherapy due to the coronavirus risk at this time. The patient consented to video teletherapy and was located at her home during this session. She is aware it is the responsibility of the patient to secure confidentiality on her end of the session. The provider was in a private home office for the duration of this session.  ? ?The patient arrived on time for her webex appointment. ? ?Issues addressed: ?1-  treatment planning ?-patient and Clinician developed patient's treatment plan ?-patient participated fully in the development of her plan and agrees with her plan ? ?Treatment Plan ?Problems Addressed  ?Balancing Work and [employer], Partner Relational Problems, Postpartum Mood Disorders (PPD) ?Goals ?1. Commit to improve relationship by working together as a team. ?Objective ?Patient and spouse will clearly communicate thoughts and feelings related to parenting barriers. ?Target Date: 2022-07-28 Frequency: Biweekly  ?Progress: 0 Modality: individual  ?Objective ?Negotiate with spouse acceptable parenting techniques. ?Target Date: 2022-07-28 Frequency: Biweekly  ?Progress: 0 Modality: individual  ?Objective ?Patient and spouse will assertively address concerns with lack of consistency in parenting. ?Target Date: 2022-07-28 Frequency: Biweekly  ?Progress: 0 Modality: individual  ?Objective ?Write down parenting approaches agreed upon by patient and her spouse. ?Target Date: 2022-07-28 Frequency: Biweekly  ?Progress: 0 Modality: individual  ?2. Develop a realistic perspective regarding demands and obligations of multiple roles and their completion. ?3. Develop an  increased sense of self-efficacy regarding childrearing, revised roles, and new identity. ?4. Eliminate depressive and anxiety symptoms associated with trying to balance multiple roles. ?Objective ?Describe at least three expectations related to work and family obligations and their relationship to gender role socialization and emotional conflict. ?Target Date: 2022-07-28 Frequency: Biweekly  ?Progress: 0 Modality: individual  ?Related Interventions ?Discuss the client's potential feelings of guilt, shame, and anxiety associated with not meeting internal or external expectations. ?Assist the client with identifying those expectations that are helpful and those that are potentially harmful; help the client discard those that are causing undue stress and strain and replace them with more realistic messages. ?Objective ?Clarify values and priorities. ?Target Date: 2022-07-28 Frequency: Biweekly  ?Progress: 0 Modality: individual  ?Objective ?Identify at least five practical ways to manage stress associated with multiple demands. ?Target Date: 2022-07-28 Frequency: Biweekly  ?Progress: 0 Modality: individual  ?Objective ?Commit to a healthy eating, sleeping, and exercise routine. ?Target Date: 2022-07-28 Frequency: Biweekly  ?Progress: 0 Modality: individual  ?Objective ?Generate a list of self-care activities and make a commitment to regularly participate in such activities. ?Target Date: 2022-07-28 Frequency: Biweekly  ?Progress: 0 Modality: individual  ?Objective ?Learn and implement stress management and relaxation techniques to reduce fatigue, anxiety, and depressive symptoms. ?Target Date: 2022-07-28 Frequency: Biweekly  ?Progress: 0 Modality: individual  ?Related Interventions ?Explore, along with the client, strategies for making her life more manageable and less stressful (e.g., waking up before the children to exercise or have a cup of tea, pack the children's lunches and backpacks the night before). ?5.  Finding a balance between gentle and nurturing parenting vs. authoritarian parenting. ?6. Increase awareness of own role in relationship conflicts. ?Objective ?Utilize at least two new conflict-resolution techniques to resolve issues reasonably. ?Target Date: 2022-07-28 Frequency: Biweekly  ?Progress: 0  Modality: individual  ?7. Maintain a balance between the multiple demands of motherhood, work, and other life roles and responsibilities. ?8. Negotiate with the partner to meet the basic responsibilities of childcare, work, relationship, and family in a mutually agreeable manner. ?9. Reduce the signs and symptoms related to depression and anxiety. ?Objective ?Replace negative thoughts about self with more positive, constructive thoughts. ?Target Date: 2022-07-28 Frequency: Biweekly  ?Progress: 0 Modality: individual  ?Objective ?Reduce self-pressure to do all of the childcare; and if returning to work, only take on what is manageable, if possible, until adjustment to motherhood is made. ?Target Date: 2022-07-28 Frequency: Biweekly  ?Progress: 0 Modality: individual  ?Related Interventions ?Explore the client's level of satisfaction with her partner's participation in childcare, household chores, and provision of emotional support. ?Objective ?Connect with other mothers at least once per week to discuss issues such as nursing, and balancing multiple roles. ?Target Date: 2022-07-28 Frequency: Biweekly  ?Progress: 0 Modality: individual  ?Objective ?Nurture healthy relationships with existing friends, and cultivate new social contacts. ?Target Date: 2022-07-28 Frequency: Biweekly  ?Progress: 0 Modality: individual  ?Objective ?Verbalize an understanding of the connection between mood disorder symptoms and significant life changes. ?Target Date: 2022-07-28 Frequency: Biweekly  ?Progress: 0 Modality: individual  ?Related Interventions ?Teach the client to counter defeatist self-statements regarding postpartum mood  symptoms (e.g., replace "I am ashamed and embarrassed that I have PPD" with "What I am feeling are symptoms of this illness; I am not making this up"; or "I can snap out of this if I try harder" to "I can choose to be active in the course of my recovery and help myself feel better"). ?Explore and challenge the client's distorted thinking that has contributed to feelings of inadequacy or self-blame (e.g., "I should always enjoy being a mother"; "If I ask for help, then people will think that I can't do any of this by myself"; "If I decide to do something just for myself, then I am selfish"). ?Affirm the client's competence by highlighting her accomplishments and identifying her strengths. ?10. Work as a Therapist, occupational and compromise on healthy parenting techniques. ? ?Diagnosis: Postpartum anxiety ? ?Attention deficit hyperactivity, predominantly inattentive type ? ?Plan:  ?-begin to identify triggers to anxiety and anger. ?-meet again on Monday, August 06, 2021 at 1pm. ? ? ?

## 2021-08-06 ENCOUNTER — Ambulatory Visit (INDEPENDENT_AMBULATORY_CARE_PROVIDER_SITE_OTHER): Payer: BC Managed Care – PPO | Admitting: Professional

## 2021-08-06 ENCOUNTER — Encounter: Payer: Self-pay | Admitting: Professional

## 2021-08-06 DIAGNOSIS — O99345 Other mental disorders complicating the puerperium: Secondary | ICD-10-CM

## 2021-08-06 DIAGNOSIS — F418 Other specified anxiety disorders: Secondary | ICD-10-CM

## 2021-08-06 DIAGNOSIS — F900 Attention-deficit hyperactivity disorder, predominantly inattentive type: Secondary | ICD-10-CM

## 2021-08-06 NOTE — Progress Notes (Signed)
[facility] Counselor/Therapist Progress Note ? ?Patient ID: Crystal AlpersShelby-Anne Bernard, MRN: [id number],   ? ?Date: 08/06/2021 ? ?Time Spent: 45 minutes 1-145pm  ? ?Treatment Type: Individual Therapy ? ?Risk Assessment: ?Danger to Self:  No ?Self-injurious Behavior: No ?Danger to Others: No ? ?Subjective: This session was held via video teletherapy due to the coronavirus risk at this time. The patient consented to video teletherapy and was located at her home during this session. She is aware it is the responsibility of the patient to secure confidentiality on her end of the session. The provider was in a private home office for the duration of this session.  ? ?The patient arrived on time for her webex appointment. ? ?Issues addressed: ?1- homework-completed ?-begin to identify triggers to anxiety and anger. ?-she noticed more since looking for it ?-she noticed about one time per day ?-she was always able to calm herself down ?  -it was related to schedules ?  -running late ?-triggers ?  -spends a lot of time with kids getting ready to do something else ?  -getting ready for dinner ?  -getting ready for bed ?2-partner relational problems ?-spoke with husband about discussing how to parent but have not yet ?-she would like to have a gentler approach to parenting ?  -her husband appears willing to learn ?  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  -when she doesn't lash out at the kids ?  -currently three time per wk/wants it to be 1/mo ?    -discussed considering going to 1/wk ane moving toward 1/mo ?  -how to allow time for self ?    -allowing Crystal Bernard to get his own drink and snack when he wants it ?    -talking with Crystal Bernard on way home about his list of needs/wants from her ? ?Treatment Plan ?Problems Addressed   ?Balancing Work and [employer]/Multiple Roles, Partner Relational Problems, Postpartum Mood Disorders (PPD) ?Goals ?1. Commit to improve relationship by working together as a team. ?Objective ?Patient and spouse will clearly communicate thoughts and feelings related to parenting barriers. ?Target Date: 2022-07-28 Frequency: Biweekly  ?Progress: 0 Modality: individual  ?Objective ?Negotiate with spouse acceptable parenting techniques. ?Target Date: 2022-07-28 Frequency: Biweekly  ?Progress: 0 Modality: individual  ?Objective ?Patient and spouse will assertively address concerns with lack of consistency in parenting. ?Target Date: 2022-07-28 Frequency: Biweekly  ?Progress: 0 Modality: individual  ?Objective ?Write down parenting approaches agreed upon by patient and her spouse. ?Target Date: 2022-07-28 Frequency: Biweekly  ?Progress: 0 Modality: individual  ?2. Develop a realistic perspective regarding demands and obligations of multiple roles and their completion. ?3. Develop an increased sense of self-efficacy regarding childrearing, revised roles, and new identity. ?4. Eliminate depressive and anxiety symptoms associated with trying to balance multiple roles. ?Objective ?Describe at least three expectations related to work and family obligations and their relationship to gender role socialization and emotional conflict. ?Target Date: 2022-07-28 Frequency: Biweekly  ?Progress: 0 Modality: individual  ?Related Interventions ?Discuss the client's potential feelings of guilt, shame, and anxiety associated with not meeting internal or external expectations. ?Assist the client with identifying those expectations that are helpful and those that are potentially harmful; help the client discard those that are causing undue stress and strain and replace them with more realistic messages. ?Objective ?Clarify values and priorities. ?Target Date:  2022-07-28 Frequency: Biweekly  ?Progress: 0 Modality: individual   ?Objective ?Identify at least five practical ways to manage stress associated with multiple demands. ?Target Date: 2022-07-28 Frequency: Biweekly  ?Progress: 0 Modality: individual  ?Objective ?Commit to a healthy eating, sleeping, and exercise routine. ?Target Date: 2022-07-28 Frequency: Biweekly  ?Progress: 0 Modality: individual  ?Objective ?Generate a list of self-care activities and make a commitment to regularly participate in such activities. ?Target Date: 2022-07-28 Frequency: Biweekly  ?Progress: 0 Modality: individual  ?Objective ?Learn and implement stress management and relaxation techniques to reduce fatigue, anxiety, and depressive symptoms. ?Target Date: 2022-07-28 Frequency: Biweekly  ?Progress: 0 Modality: individual  ?Related Interventions ?Explore, along with the client, strategies for making her life more manageable and less stressful (e.g., waking up before the children to exercise or have a cup of tea, pack the children's lunches and backpacks the night before). ?5. Finding a balance between gentle and nurturing parenting vs. authoritarian parenting. ?6. Increase awareness of own role in relationship conflicts. ?Objective ?Utilize at least two new conflict-resolution techniques to resolve issues reasonably. ?Target Date: 2022-07-28 Frequency: Biweekly  ?Progress: 0 Modality: individual  ?7. Maintain a balance between the multiple demands of motherhood, work, and other life roles and responsibilities. ?8. Negotiate with the partner to meet the basic responsibilities of childcare, work, relationship, and family in a mutually agreeable manner. ?9. Reduce the signs and symptoms related to depression and anxiety. ?Objective ?Replace negative thoughts about self with more positive, constructive thoughts. ?Target Date: 2022-07-28 Frequency: Biweekly  ?Progress: 0 Modality: individual  ?Objective ?Reduce self-pressure to do all of the childcare; and if returning to work, only take on what is  manageable, if possible, until adjustment to motherhood is made. ?Target Date: 2022-07-28 Frequency: Biweekly  ?Progress: 0 Modality: individual  ?Related Interventions ?Explore the client's level of satisfaction with her partner's participation in childcare, household chores, and provision of emotional support. ?Objective ?Connect with other mothers at least once per week to discuss issues such as nursing, and balancing multiple roles. ?Target Date: 2022-07-28 Frequency: Biweekly  ?Progress: 0 Modality: individual  ?Objective ?Nurture healthy relationships with existing friends, and cultivate new social contacts. ?Target Date: 2022-07-28 Frequency: Biweekly  ?Progress: 0 Modality: individual  ?Objective ?Verbalize an understanding of the connection between mood disorder symptoms and significant life changes. ?Target Date: 2022-07-28 Frequency: Biweekly  ?Progress: 0 Modality: individual  ?Related Interventions ?Teach the client to counter defeatist self-statements regarding postpartum mood symptoms (e.g., replace "I am ashamed and embarrassed that I have PPD" with "What I am feeling are symptoms of this illness; I am not making this up"; or "I can snap out of this if I try harder" to "I can choose to be active in the course of my recovery and help myself feel better"). ?Explore and challenge the client's distorted thinking that has contributed to feelings of inadequacy or self-blame (e.g., "I should always enjoy being a mother"; "If I ask for help, then people will think that I can't do any of this by myself"; "If I decide to do something just for myself, then I am selfish"). ?Affirm the client's competence by highlighting her accomplishments and identifying her strengths. ?10. Work as a Therapist, occupational and compromise on healthy parenting techniques. ? ?Diagnosis: Postpartum anxiety ? ?Attention deficit hyperactivity, predominantly inattentive type ? ?Plan:  ?-take time with [name] to talk about parenting ?-try  techniques with Crystal Cass to become more self-sufficient (getting his own drink and snack) ?-meet again on Monday, Aug 13, 2021 at  10am. ? ?Teofilo Pod, [facility] ?

## 2021-08-13 ENCOUNTER — Ambulatory Visit: Payer: BC Managed Care – PPO | Admitting: Professional

## 2021-08-14 DIAGNOSIS — Z8742 Personal history of other diseases of the female genital tract: Secondary | ICD-10-CM | POA: Diagnosis not present

## 2021-08-14 DIAGNOSIS — M545 Low back pain, unspecified: Secondary | ICD-10-CM | POA: Diagnosis not present

## 2021-08-14 DIAGNOSIS — R102 Pelvic and perineal pain: Secondary | ICD-10-CM | POA: Diagnosis not present

## 2021-08-22 ENCOUNTER — Other Ambulatory Visit: Payer: Self-pay | Admitting: Physician Assistant

## 2021-08-22 DIAGNOSIS — F900 Attention-deficit hyperactivity disorder, predominantly inattentive type: Secondary | ICD-10-CM

## 2021-08-22 MED ORDER — AMPHETAMINE-DEXTROAMPHETAMINE 10 MG PO TABS: 10.0000 mg | ORAL_TABLET | Freq: Two times a day (BID) | ORAL | 0 refills | Status: DC

## 2021-08-24 ENCOUNTER — Ambulatory Visit (INDEPENDENT_AMBULATORY_CARE_PROVIDER_SITE_OTHER): Payer: BC Managed Care – PPO | Admitting: Professional

## 2021-08-24 ENCOUNTER — Encounter: Payer: Self-pay | Admitting: Professional

## 2021-08-24 DIAGNOSIS — F900 Attention-deficit hyperactivity disorder, predominantly inattentive type: Secondary | ICD-10-CM

## 2021-08-24 DIAGNOSIS — O99345 Other mental disorders complicating the puerperium: Secondary | ICD-10-CM

## 2021-08-24 DIAGNOSIS — F418 Other specified anxiety disorders: Secondary | ICD-10-CM

## 2021-08-24 NOTE — Progress Notes (Signed)
A user error has taken place: encounter opened in error, closed for administrative reasons.               Delaynee Alred, LCMHC 

## 2021-09-04 ENCOUNTER — Encounter: Payer: Self-pay | Admitting: Professional

## 2021-09-04 ENCOUNTER — Ambulatory Visit (INDEPENDENT_AMBULATORY_CARE_PROVIDER_SITE_OTHER): Payer: BC Managed Care – PPO | Admitting: Professional

## 2021-09-04 DIAGNOSIS — F418 Other specified anxiety disorders: Secondary | ICD-10-CM | POA: Diagnosis not present

## 2021-09-04 DIAGNOSIS — F900 Attention-deficit hyperactivity disorder, predominantly inattentive type: Secondary | ICD-10-CM

## 2021-09-04 NOTE — Progress Notes (Signed)
[facility] Counselor/Therapist Progress Note  Patient ID: Crystal Bernard, MRN: [id number],    Date: 09/04/2021  Time Spent: 48 minutes 101-149pm   Treatment Type: Individual Therapy  Risk Assessment: Danger to Self:  No Self-injurious Behavior: No Danger to Others: No  Subjective: This session was held via video teletherapy. The patient consented to video teletherapy and was located at her home during this session. She is aware it is the responsibility of the patient to secure confidentiality on her end of the session. The provider was in a private home office for the duration of this session.   The patient arrived on time for her webex appointment.  Issues addressed: 1- homework-completed -take time with [name] to talk about parenting   -talk went really well, he's been using some techniques to use before choosing spanking -try techniques with Crystal Bernard to become more self-sufficient (getting his own drink and snack)   -mommy time out has not worked well     -kids felt they were being penalized   -she has given Environmental consultant some autonomy in regard to getting his snacks   -she has milk cups ready in the fridge and an extra cup of water on the counter 2-difficult week since her father has had an increase in health issues -parents live with pt and her family -father has recently been diagnosed with pulmonary fibrosis   -short life expectancy and limited treatment options   -he is requiring increased oxygen -her mother's emotions  3-pt had strained relationship with father as a child but since he has become more spiritually focused and saved he has been so much better -he is not emotionally deep -pt tearful when she describes that the other father's were so normal   -her father was not present through elementary school due to work   -once she was older he was home getting high   -in college he was in treatment and working on his sobriety -ideally pt would want her  father    -if her father was able to come to events and things   -they are planning a 2nd birthday party  -she is concerned for her children since they are so attached to him   -talked about how to consider introducing the subject of death   -suggested reading story books appropriate to their age 1-relationship with mother -had to be each other's supports when her father was addicted   -pt had to set clear boundaries with her mother to stop treating the pt like she was her best friend -her mother is very dramatic and does not think the pt is dealing with the reality of her father's illness -her mom is emotionally needy -her mom has girlfriends that she goes out to dinner with but her mom is generally the fixer 5-anxiety -pt reports doing relatively well -doesn't feels the anxiety as much -has decreased irritability toward children  Treatment Plan Problems Addressed  Balancing Work and [employer], Partner Relational Problems, Postpartum Mood Disorders (PPD) Goals 1. Commit to improve relationship by working together as a team. Objective Patient and spouse will clearly communicate thoughts and feelings related to parenting barriers. Target Date: 2022-07-28 Frequency: Biweekly  Progress: 0 Modality: individual  Objective Negotiate with spouse acceptable parenting techniques. Target Date: 2022-07-28 Frequency: Biweekly  Progress: 0 Modality: individual  Objective Patient and spouse will assertively address concerns with lack of consistency in parenting. Target Date: 2022-07-28 Frequency: Biweekly  Progress: 0 Modality: individual  Objective Write down parenting approaches agreed  upon by patient and her spouse. Target Date: 2022-07-28 Frequency: Biweekly  Progress: 0 Modality: individual  2. Develop a realistic perspective regarding demands and obligations of multiple roles and their completion. 3. Develop an increased sense of self-efficacy regarding childrearing,  revised roles, and new identity. 4. Eliminate depressive and anxiety symptoms associated with trying to balance multiple roles. Objective Describe at least three expectations related to work and family obligations and their relationship to gender role socialization and emotional conflict. Target Date: 2022-07-28 Frequency: Biweekly  Progress: 0 Modality: individual  Related Interventions Discuss the client's potential feelings of guilt, shame, and anxiety associated with not meeting internal or external expectations. Assist the client with identifying those expectations that are helpful and those that are potentially harmful; help the client discard those that are causing undue stress and strain and replace them with more realistic messages. Objective Clarify values and priorities. Target Date: 2022-07-28 Frequency: Biweekly  Progress: 0 Modality: individual  Objective Identify at least five practical ways to manage stress associated with multiple demands. Target Date: 2022-07-28 Frequency: Biweekly  Progress: 0 Modality: individual  Objective Commit to a healthy eating, sleeping, and exercise routine. Target Date: 2022-07-28 Frequency: Biweekly  Progress: 0 Modality: individual  Objective Generate a list of self-care activities and make a commitment to regularly participate in such activities. Target Date: 2022-07-28 Frequency: Biweekly  Progress: 0 Modality: individual  Objective Learn and implement stress management and relaxation techniques to reduce fatigue, anxiety, and depressive symptoms. Target Date: 2022-07-28 Frequency: Biweekly  Progress: 0 Modality: individual  Related Interventions Explore, along with the client, strategies for making her life more manageable and less stressful (e.g., waking up before the children to exercise or have a cup of tea, pack the children's lunches and backpacks the night before). 5. Finding a balance between gentle and nurturing parenting vs.  authoritarian parenting. 6. Increase awareness of own role in relationship conflicts. Objective Utilize at least two new conflict-resolution techniques to resolve issues reasonably. Target Date: 2022-07-28 Frequency: Biweekly  Progress: 0 Modality: individual  7. Maintain a balance between the multiple demands of motherhood, work, and other life roles and responsibilities. 8. Negotiate with the partner to meet the basic responsibilities of childcare, work, relationship, and family in a mutually agreeable manner. 9. Reduce the signs and symptoms related to depression and anxiety. Objective Replace negative thoughts about self with more positive, constructive thoughts. Target Date: 2022-07-28 Frequency: Biweekly  Progress: 0 Modality: individual  Objective Reduce self-pressure to do all of the childcare; and if returning to work, only take on what is manageable, if possible, until adjustment to motherhood is made. Target Date: 2022-07-28 Frequency: Biweekly  Progress: 0 Modality: individual  Related Interventions Explore the client's level of satisfaction with her partner's participation in childcare, household chores, and provision of emotional support. Objective Connect with other mothers at least once per week to discuss issues such as nursing, and balancing multiple roles. Target Date: 2022-07-28 Frequency: Biweekly  Progress: 0 Modality: individual  Objective Nurture healthy relationships with existing friends, and cultivate new social contacts. Target Date: 2022-07-28 Frequency: Biweekly  Progress: 0 Modality: individual  Objective Verbalize an understanding of the connection between mood disorder symptoms and significant life changes. Target Date: 2022-07-28 Frequency: Biweekly  Progress: 0 Modality: individual  Related Interventions Teach the client to counter defeatist self-statements regarding postpartum mood symptoms (e.g., replace "I am ashamed and embarrassed that I have  PPD" with "What I am feeling are symptoms of this illness; I am not making  this up"; or "I can snap out of this if I try harder" to "I can choose to be active in the course of my recovery and help myself feel better"). Explore and challenge the client's distorted thinking that has contributed to feelings of inadequacy or self-blame (e.g., "I should always enjoy being a mother"; "If I ask for help, then people will think that I can't do any of this by myself"; "If I decide to do something just for myself, then I am selfish"). Affirm the client's competence by highlighting her accomplishments and identifying her strengths. 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  Diagnosis: Postpartum anxiety  Attention deficit hyperactivity, predominantly inattentive type  Plan:  -talk with dad about his bucket list -meet again on Monday, September 17, 2021 at [place], [facility]

## 2021-09-17 ENCOUNTER — Ambulatory Visit: Payer: BC Managed Care – PPO | Admitting: Professional

## 2021-09-18 ENCOUNTER — Encounter: Payer: Self-pay | Admitting: Obstetrics and Gynecology

## 2021-09-18 ENCOUNTER — Ambulatory Visit: Payer: BC Managed Care – PPO | Admitting: Obstetrics and Gynecology

## 2021-09-18 ENCOUNTER — Other Ambulatory Visit (HOSPITAL_COMMUNITY)
Admission: RE | Admit: 2021-09-18 | Discharge: 2021-09-18 | Disposition: A | Discharge status: Home / Self Care | Payer: BC Managed Care – PPO | Source: Ambulatory Visit | Attending: Obstetrics and Gynecology | Admitting: Obstetrics and Gynecology

## 2021-09-18 VITALS — BP 116/79 | HR 77 | Resp 16 | Ht 65.5 in | Wt 203.0 lb

## 2021-09-18 DIAGNOSIS — Z3202 Encounter for pregnancy test, result negative: Secondary | ICD-10-CM

## 2021-09-18 DIAGNOSIS — Z124 Encounter for screening for malignant neoplasm of cervix: Secondary | ICD-10-CM

## 2021-09-18 DIAGNOSIS — Z30430 Encounter for insertion of intrauterine contraceptive device: Secondary | ICD-10-CM | POA: Diagnosis not present

## 2021-09-18 LAB — POCT URINE PREGNANCY: Preg Test, Ur: NEGATIVE

## 2021-09-18 MED ORDER — LEVONORGESTREL 20 MCG/DAY IU IUD
1.0000 | INTRAUTERINE_SYSTEM | Freq: Once | INTRAUTERINE | Status: AC
  Administered 2021-09-18: 1 via INTRAUTERINE

## 2021-09-18 NOTE — Progress Notes (Signed)
   GYNECOLOGY CLINIC PROCEDURE NOTE  Crystal Bernard is a 31 y.o. G2P2 here for Mirena IUD insertion. No GYN concerns.  Last pap smear was on 2019 and was normal. Pap collected today.   IUD Insertion Procedure Note Patient identified, informed consent performed, consent signed.   Discussed risks of irregular bleeding, cramping, infection, malpositioning or misplacement of the IUD outside the uterus which may require further procedure such as laparoscopy. Time out was performed.  Urine pregnancy test negative.  Speculum placed in the vagina.  Cervix visualized.  Cleaned with Betadine x 2.  Grasped anteriorly with a single tooth tenaculum.  Uterus sounded to 7 cm.  Mirena IUD placed per manufacturer's recommendations.  Strings trimmed to 3 cm. Tenaculum was removed, good hemostasis noted.  Patient tolerated procedure well.   Patient was given post-procedure instructions.  She was advised to have backup contraception for one week.  Patient was also asked to check IUD strings periodically and follow up in 4 weeks for IUD check.    Venia Carbon I, NP 09/18/2021 1:28 PM

## 2021-09-19 LAB — CYTOLOGY - PAP: Diagnosis: NEGATIVE

## 2021-09-21 ENCOUNTER — Other Ambulatory Visit: Payer: Self-pay | Admitting: Physician Assistant

## 2021-09-21 MED ORDER — WEGOVY 2.4 MG/0.75ML ~~LOC~~ SOAJ: 2.4000 mg | SUBCUTANEOUS | 0 refills | Status: DC

## 2021-09-21 MED ORDER — WEGOVY 1.7 MG/0.75ML ~~LOC~~ SOAJ: 1.7000 mg | SUBCUTANEOUS | 0 refills | Status: DC

## 2021-09-21 NOTE — Progress Notes (Signed)
Pt is tolerating well. She continues to lose weight. She has lost 7lbs. Increased to 1.7mg  and 2.4mg . follow up in 4 months.

## 2021-10-18 ENCOUNTER — Ambulatory Visit: Payer: BC Managed Care – PPO | Admitting: Obstetrics and Gynecology

## 2021-11-06 ENCOUNTER — Encounter: Payer: Self-pay | Admitting: Professional

## 2021-11-06 ENCOUNTER — Ambulatory Visit (INDEPENDENT_AMBULATORY_CARE_PROVIDER_SITE_OTHER): Payer: BC Managed Care – PPO | Admitting: Professional

## 2021-11-06 DIAGNOSIS — F900 Attention-deficit hyperactivity disorder, predominantly inattentive type: Secondary | ICD-10-CM

## 2021-11-06 DIAGNOSIS — F4322 Adjustment disorder with anxiety: Secondary | ICD-10-CM | POA: Diagnosis not present

## 2021-11-06 DIAGNOSIS — F418 Other specified anxiety disorders: Secondary | ICD-10-CM

## 2021-11-06 DIAGNOSIS — O99345 Other mental disorders complicating the puerperium: Secondary | ICD-10-CM

## 2021-11-06 NOTE — Progress Notes (Signed)
[facility] Counselor/Therapist Progress Note  Patient ID: Crystal Bernard, MRN: [id number],    Date: 11/06/2021  Time Spent: 48 minutes 11-1148am   Treatment Type: Individual Therapy  Risk Assessment: Danger to Self:  No Self-injurious Behavior: No Danger to Others: No  Subjective: This session was held via video teletherapy. The patient consented to video teletherapy and was located at her home during this session. She is aware it is the responsibility of the patient to secure confidentiality on her end of the session. The provider was in a private home office for the duration of this session.   The patient arrived on time for her webex appointment.  Issues addressed: 1- mood -more calm and less angry -stepping back and seeing that it was not a big deal -tries to address what is happening and not put  2-father's health -[facility] denied a transplant -he was referred to an organ specialist in the [place] -there appears to be more of an issue with cause unknown   -he has a honeycombing of his lung and he cannot expand     -it is to as bad as his oxygen levels -he is going to see what else is going on -mom is a good advocate for her father -her father has given permission [facility] -her father is not eating to lose weight -his toxic habits from his "addictive personality" -pt saw her behaviors came from her parents since they have been living in the pt's home -as an only child she was over-communicated to her   -Leta Jungling has always been surprised by this   -pt is trying to set limits with her mother     -she very simply cuts off the conversation or "has an attitude"     -when she verbalizes a boundary   -her husband has identified that she slips into 29 year old "Sass" (pt's nickname as child) -multi-generational home -pt stressed over mother's neediness -pt does not want to hurt her feelings -husband is frustrated and sometimes going to bed at 7pm  because he doesn't want to hear her mother talk anymore -how to set gentle boundaries with mother -options for gaining own space when parents live in same home  Treatment Plan Problems Addressed  Balancing Work and [employer], Partner Relational Problems, Postpartum Mood Disorders (PPD) Goals 1. Commit to improve relationship by working together as a team. Objective Patient and spouse will clearly communicate thoughts and feelings related to parenting barriers. Target Date: 2022-07-28 Frequency: Biweekly  Progress: 0 Modality: individual  Objective Negotiate with spouse acceptable parenting techniques. Target Date: 2022-07-28 Frequency: Biweekly  Progress: 0 Modality: individual  Objective Patient and spouse will assertively address concerns with lack of consistency in parenting. Target Date: 2022-07-28 Frequency: Biweekly  Progress: 0 Modality: individual  Objective Write down parenting approaches agreed upon by patient and her spouse. Target Date: 2022-07-28 Frequency: Biweekly  Progress: 0 Modality: individual  2. Develop a realistic perspective regarding demands and obligations of multiple roles and their completion. 3. Develop an increased sense of self-efficacy regarding childrearing, revised roles, and new identity. 4. Eliminate depressive and anxiety symptoms associated with trying to balance multiple roles. Objective Describe at least three expectations related to work and family obligations and their relationship to gender role socialization and emotional conflict. Target Date: 2022-07-28 Frequency: Biweekly  Progress: 0 Modality: individual  Related Interventions Discuss the client's potential feelings of guilt, shame, and anxiety associated with not meeting internal or external  expectations. Assist the client with identifying those expectations that are helpful and those that are potentially harmful; help the client discard those that are causing undue stress  and strain and replace them with more realistic messages. Objective Clarify values and priorities. Target Date: 2022-07-28 Frequency: Biweekly  Progress: 0 Modality: individual  Objective Identify at least five practical ways to manage stress associated with multiple demands. Target Date: 2022-07-28 Frequency: Biweekly  Progress: 0 Modality: individual  Objective Commit to a healthy eating, sleeping, and exercise routine. Target Date: 2022-07-28 Frequency: Biweekly  Progress: 0 Modality: individual  Objective Generate a list of self-care activities and make a commitment to regularly participate in such activities. Target Date: 2022-07-28 Frequency: Biweekly  Progress: 0 Modality: individual  Objective Learn and implement stress management and relaxation techniques to reduce fatigue, anxiety, and depressive symptoms. Target Date: 2022-07-28 Frequency: Biweekly  Progress: 0 Modality: individual  Related Interventions Explore, along with the client, strategies for making her life more manageable and less stressful (e.g., waking up before the children to exercise or have a cup of tea, pack the children's lunches and backpacks the night before). 5. Finding a balance between gentle and nurturing parenting vs. authoritarian parenting. 6. Increase awareness of own role in relationship conflicts. Objective Utilize at least two new conflict-resolution techniques to resolve issues reasonably. Target Date: 2022-07-28 Frequency: Biweekly  Progress: 0 Modality: individual  7. Maintain a balance between the multiple demands of motherhood, work, and other life roles and responsibilities. 8. Negotiate with the partner to meet the basic responsibilities of childcare, work, relationship, and family in a mutually agreeable manner. 9. Reduce the signs and symptoms related to depression and anxiety. Objective Replace negative thoughts about self with more positive, constructive thoughts. Target Date:  2022-07-28 Frequency: Biweekly  Progress: 0 Modality: individual  Objective Reduce self-pressure to do all of the childcare; and if returning to work, only take on what is manageable, if possible, until adjustment to motherhood is made. Target Date: 2022-07-28 Frequency: Biweekly  Progress: 0 Modality: individual  Related Interventions Explore the client's level of satisfaction with her partner's participation in childcare, household chores, and provision of emotional support. Objective Connect with other mothers at least once per week to discuss issues such as nursing, and balancing multiple roles. Target Date: 2022-07-28 Frequency: Biweekly  Progress: 0 Modality: individual  Objective Nurture healthy relationships with existing friends, and cultivate new social contacts. Target Date: 2022-07-28 Frequency: Biweekly  Progress: 0 Modality: individual  Objective Verbalize an understanding of the connection between mood disorder symptoms and significant life changes. Target Date: 2022-07-28 Frequency: Biweekly  Progress: 0 Modality: individual  Related Interventions Teach the client to counter defeatist self-statements regarding postpartum mood symptoms (e.g., replace "I am ashamed and embarrassed that I have PPD" with "What I am feeling are symptoms of this illness; I am not making this up"; or "I can snap out of this if I try harder" to "I can choose to be active in the course of my recovery and help myself feel better"). Explore and challenge the client's distorted thinking that has contributed to feelings of inadequacy or self-blame (e.g., "I should always enjoy being a mother"; "If I ask for help, then people will think that I can't do any of this by myself"; "If I decide to do something just for myself, then I am selfish"). Affirm the client's competence by highlighting her accomplishments and identifying her strengths. 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  Diagnosis: Postpartum anxiety  Attention deficit hyperactivity, predominantly inattentive type  Plan:  -meet again on Thursday, November 29, 2021 at 12pm.  Teofilo Pod, [facility]

## 2021-11-19 ENCOUNTER — Telehealth: Payer: Self-pay | Admitting: Physician Assistant

## 2021-11-19 MED ORDER — OFLOXACIN 0.3 % OT SOLN: 10.0000 [drp] | Freq: Every day | OTIC | 0 refills | Status: DC

## 2021-11-19 NOTE — Telephone Encounter (Signed)
Pt called in with right ear pain after using her air pods without cleaning them. Hx of otitis externa. Right ear tender to touch. No fever, chills, cough, URI symptoms.   Sent ofloxacin to pharmacy.

## 2021-11-29 ENCOUNTER — Ambulatory Visit: Payer: BC Managed Care – PPO | Admitting: Professional

## 2021-12-13 ENCOUNTER — Ambulatory Visit: Payer: BC Managed Care – PPO | Admitting: Professional

## 2021-12-20 ENCOUNTER — Encounter: Payer: Self-pay | Admitting: Professional

## 2021-12-20 ENCOUNTER — Ambulatory Visit (INDEPENDENT_AMBULATORY_CARE_PROVIDER_SITE_OTHER): Payer: BC Managed Care – PPO | Admitting: Professional

## 2021-12-20 DIAGNOSIS — F900 Attention-deficit hyperactivity disorder, predominantly inattentive type: Secondary | ICD-10-CM

## 2021-12-20 DIAGNOSIS — O99345 Other mental disorders complicating the puerperium: Secondary | ICD-10-CM | POA: Diagnosis not present

## 2021-12-20 DIAGNOSIS — F4322 Adjustment disorder with anxiety: Secondary | ICD-10-CM

## 2021-12-20 DIAGNOSIS — F418 Other specified anxiety disorders: Secondary | ICD-10-CM | POA: Diagnosis not present
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
## 2022-01-08 ENCOUNTER — Other Ambulatory Visit: Payer: Self-pay | Admitting: Physician Assistant

## 2022-01-08 MED ORDER — WEGOVY 2.4 MG/0.75ML ~~LOC~~ SOAJ
2.4000 mg | SUBCUTANEOUS | 1 refills | Status: DC
Start: 2022-01-08 — End: 2022-02-13

## 2022-01-08 MED ORDER — WEGOVY 2.4 MG/0.75ML ~~LOC~~ SOAJ
2.4000 mg | SUBCUTANEOUS | 1 refills | Status: DC
Start: 2022-01-08 — End: 2022-01-08

## 2022-01-08 NOTE — Progress Notes (Signed)
Requested wegovy to be sent to another pharmacy.

## 2022-01-10 ENCOUNTER — Ambulatory Visit: Payer: BC Managed Care – PPO | Admitting: Professional

## 2022-01-11 ENCOUNTER — Telehealth: Payer: Self-pay

## 2022-01-11 NOTE — Telephone Encounter (Addendum)
Initiated Prior authorization ACQ:PEAKLT 2.4MG/0.75ML auto-injectors Via: Covermymeds Case/Key: [id number] Status: denied as of 01/11/22 Reason:Medical criteria not met awaiting fax Notified Pt via: Mychart

## 2022-01-21 ENCOUNTER — Other Ambulatory Visit: Payer: Self-pay | Admitting: Physician Assistant

## 2022-01-21 ENCOUNTER — Encounter: Payer: Self-pay | Admitting: Physician Assistant

## 2022-01-21 DIAGNOSIS — F900 Attention-deficit hyperactivity disorder, predominantly inattentive type: Secondary | ICD-10-CM

## 2022-01-21 DIAGNOSIS — E6609 Other obesity due to excess calories: Secondary | ICD-10-CM

## 2022-01-21 MED ORDER — AMPHETAMINE-DEXTROAMPHETAMINE 10 MG PO TABS: 10.0000 mg | ORAL_TABLET | Freq: Two times a day (BID) | ORAL | 0 refills | Status: DC

## 2022-01-21 NOTE — Telephone Encounter (Signed)
Routing to covering provider  Last OV: 07/03/21 Next OV: no appointment scheduled  Last RF: 10/22/21

## 2022-01-21 NOTE — Telephone Encounter (Signed)
Prescription filled for 2 weeks needs a 57-month follow-up this is a scheduled drug.

## 2022-01-22 NOTE — Telephone Encounter (Signed)
Orders Placed This Encounter  °Procedures  ° Amb Ref to Medical Weight Management  °  Referral Priority:   Routine  °  Referral Type:   Consultation  °  Number of Visits Requested:   1  ° ° °

## 2022-01-23 DIAGNOSIS — Z23 Encounter for immunization: Secondary | ICD-10-CM | POA: Diagnosis not present

## 2022-01-23 NOTE — Telephone Encounter (Signed)
I called pt and scheduled appt for 01/30/22 with Iran Planas

## 2022-01-25 DIAGNOSIS — H9209 Otalgia, unspecified ear: Secondary | ICD-10-CM | POA: Diagnosis not present

## 2022-01-25 DIAGNOSIS — H6503 Acute serous otitis media, bilateral: Secondary | ICD-10-CM | POA: Diagnosis not present

## 2022-01-29 ENCOUNTER — Other Ambulatory Visit: Payer: Self-pay | Admitting: Physician Assistant

## 2022-01-29 DIAGNOSIS — M545 Low back pain, unspecified: Secondary | ICD-10-CM

## 2022-01-29 DIAGNOSIS — E6609 Other obesity due to excess calories: Secondary | ICD-10-CM

## 2022-01-29 MED ORDER — MOUNJARO 2.5 MG/0.5ML ~~LOC~~ SOAJ: 2.5000 mg | SUBCUTANEOUS | 0 refills | Status: DC

## 2022-01-29 NOTE — Progress Notes (Signed)
Wegovy not covered. Switched to [employer].

## 2022-01-30 ENCOUNTER — Ambulatory Visit: Payer: BC Managed Care – PPO | Admitting: Physician Assistant

## 2022-01-31 ENCOUNTER — Ambulatory Visit (INDEPENDENT_AMBULATORY_CARE_PROVIDER_SITE_OTHER): Payer: BC Managed Care – PPO | Admitting: Professional

## 2022-01-31 ENCOUNTER — Ambulatory Visit: Payer: BC Managed Care – PPO | Admitting: Bariatrics

## 2022-01-31 ENCOUNTER — Telehealth: Payer: Self-pay

## 2022-01-31 ENCOUNTER — Encounter: Payer: Self-pay | Admitting: Professional

## 2022-01-31 DIAGNOSIS — O99345 Other mental disorders complicating the puerperium: Secondary | ICD-10-CM

## 2022-01-31 DIAGNOSIS — F900 Attention-deficit hyperactivity disorder, predominantly inattentive type: Secondary | ICD-10-CM | POA: Diagnosis not present

## 2022-01-31 DIAGNOSIS — F4322 Adjustment disorder with anxiety: Secondary | ICD-10-CM

## 2022-01-31 DIAGNOSIS — F418 Other specified anxiety disorders: Secondary | ICD-10-CM | POA: Diagnosis not present

## 2022-01-31 NOTE — Progress Notes (Signed)
[facility] Counselor/Therapist Progress Note  Patient ID: Crystal Bernard, MRN: [id number],    Date: 01/31/2022  Time Spent: 38 minutes 1202-1240pm   Treatment Type: Individual Therapy  Risk Assessment: Danger to Self:  No Self-injurious Behavior: No Danger to Others: No  Subjective: This session was held via video teletherapy. The patient consented to video teletherapy and was located at her car during this session. She is aware it is the responsibility of the patient to secure confidentiality on her end of the session. The provider was in a private home office for the duration of this session.   The patient arrived on time for her webex appointment.  Issues addressed: 1- homework-read articles, did not purchase book -read article on parenting more than one child -get book Raising [employer] and begin reading 2-personal a-only child raising siblings -pt reports it was nice to know that what she is experiencing is commonly how other only children feel -pt spoke with a coworker who is in the same position   -coworker thought it was interesting how close her children were and describe it as dependent b-looking for new home -mom has suggested that she will be in master bedroom   -once she has decided she doesn't want to share a bathroom with the children -discussed options for housing and how to discuss with parents   -pt consider speaking with her dad as he is the path of least resistance   -pt consider developing different options to present to her parents     -buy two Dealer, parents accept that they will not be in the master if only a one bedroom Child psychotherapist, or parents begin search for own home b-Avery two year old daughter -starting to talk and she getting frustrated that she is not being understood -potty trained herself over the past couple weeks -she undresses herself and wants to get in the tub herself -she wipes down the frig and dishwasher  for the pt -she separates the laundry for her -sh puts her own and Bennett's shoes on and off -how to allow Denny Peon to make decisions   Treatment Plan Problems Addressed  Balancing Work and [employer], Partner Relational Problems, Postpartum Mood Disorders (PPD) Goals 1. Commit to improve relationship by working together as a team. Objective Patient and spouse will clearly communicate thoughts and feelings related to parenting barriers. Target Date: 2022-07-28 Frequency: Biweekly  Progress: 0 Modality: individual  Objective Negotiate with spouse acceptable parenting techniques. Target Date: 2022-07-28 Frequency: Biweekly  Progress: 0 Modality: individual  Objective Patient and spouse will assertively address concerns with lack of consistency in parenting. Target Date: 2022-07-28 Frequency: Biweekly  Progress: 0 Modality: individual  Objective Write down parenting approaches agreed upon by patient and her spouse. Target Date: 2022-07-28 Frequency: Biweekly  Progress: 0 Modality: individual  2. Develop a realistic perspective regarding demands and obligations of multiple roles and their completion. 3. Develop an increased sense of self-efficacy regarding childrearing, revised roles, and new identity. 4. Eliminate depressive and anxiety symptoms associated with trying to balance multiple roles. Objective Describe at least three expectations related to work and family obligations and their relationship to gender role socialization and emotional conflict. Target Date: 2022-07-28 Frequency: Biweekly  Progress: 0 Modality: individual  Related Interventions Discuss the client's potential feelings of guilt, shame, and anxiety associated with not meeting internal or external expectations. Assist the client with identifying those expectations that are helpful and those that are potentially harmful; help the client discard those that  are causing undue stress and strain and replace  them with more realistic messages. Objective Clarify values and priorities. Target Date: 2022-07-28 Frequency: Biweekly  Progress: 0 Modality: individual  Objective Identify at least five practical ways to manage stress associated with multiple demands. Target Date: 2022-07-28 Frequency: Biweekly  Progress: 0 Modality: individual  Objective Commit to a healthy eating, sleeping, and exercise routine. Target Date: 2022-07-28 Frequency: Biweekly  Progress: 0 Modality: individual  Objective Generate a list of self-care activities and make a commitment to regularly participate in such activities. Target Date: 2022-07-28 Frequency: Biweekly  Progress: 0 Modality: individual  Objective Learn and implement stress management and relaxation techniques to reduce fatigue, anxiety, and depressive symptoms. Target Date: 2022-07-28 Frequency: Biweekly  Progress: 0 Modality: individual  Related Interventions Explore, along with the client, strategies for making her life more manageable and less stressful (e.g., waking up before the children to exercise or have a cup of tea, pack the children's lunches and backpacks the night before). 5. Finding a balance between gentle and nurturing parenting vs. authoritarian parenting. 6. Increase awareness of own role in relationship conflicts. Objective Utilize at least two new conflict-resolution techniques to resolve issues reasonably. Target Date: 2022-07-28 Frequency: Biweekly  Progress: 0 Modality: individual  7. Maintain a balance between the multiple demands of motherhood, work, and other life roles and responsibilities. 8. Negotiate with the partner to meet the basic responsibilities of childcare, work, relationship, and family in a mutually agreeable manner. 9. Reduce the signs and symptoms related to depression and anxiety. Objective Replace negative thoughts about self with more positive, constructive thoughts. Target Date: 2022-07-28 Frequency:  Biweekly  Progress: 0 Modality: individual  Objective Reduce self-pressure to do all of the childcare; and if returning to work, only take on what is manageable, if possible, until adjustment to motherhood is made. Target Date: 2022-07-28 Frequency: Biweekly  Progress: 0 Modality: individual  Related Interventions Explore the client's level of satisfaction with her partner's participation in childcare, household chores, and provision of emotional support. Objective Connect with other mothers at least once per week to discuss issues such as nursing, and balancing multiple roles. Target Date: 2022-07-28 Frequency: Biweekly  Progress: 0 Modality: individual  Objective Nurture healthy relationships with existing friends, and cultivate new social contacts. Target Date: 2022-07-28 Frequency: Biweekly  Progress: 0 Modality: individual  Objective Verbalize an understanding of the connection between mood disorder symptoms and significant life changes. Target Date: 2022-07-28 Frequency: Biweekly  Progress: 0 Modality: individual  Related Interventions Teach the client to counter defeatist self-statements regarding postpartum mood symptoms (e.g., replace "I am ashamed and embarrassed that I have PPD" with "What I am feeling are symptoms of this illness; I am not making this up"; or "I can snap out of this if I try harder" to "I can choose to be active in the course of my recovery and help myself feel better"). Explore and challenge the client's distorted thinking that has contributed to feelings of inadequacy or self-blame (e.g., "I should always enjoy being a mother"; "If I ask for help, then people will think that I can't do any of this by myself"; "If I decide to do something just for myself, then I am selfish"). Affirm the client's competence by highlighting her accomplishments and identifying her strengths. 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  Diagnosis:  Postpartum anxiety  Attention deficit hyperactivity, predominantly inattentive type  Plan:  -meet again on Thursday, February 21, 2022 at 12pm.  Javione Gunawan, LCMHC °

## 2022-01-31 NOTE — Telephone Encounter (Addendum)
Initiated Prior authorization LHT:[id number] 2.5MG /0.5ML pen-injectors Via: Covermymeds Case/Key:[id number] Status: approved  as of 01/31/22 Reason:Effective from 01/31/2022 through 01/30/2023. Notified Pt via: Mychart

## 2022-02-08 DIAGNOSIS — M25572 Pain in left ankle and joints of left foot: Secondary | ICD-10-CM

## 2022-02-13 ENCOUNTER — Ambulatory Visit: Payer: BC Managed Care – PPO | Admitting: Physician Assistant

## 2022-02-13 ENCOUNTER — Encounter: Payer: Self-pay | Admitting: Physician Assistant

## 2022-02-13 VITALS — BP 104/63 | HR 75 | Ht 65.5 in | Wt 196.0 lb

## 2022-02-13 DIAGNOSIS — E6609 Other obesity due to excess calories: Secondary | ICD-10-CM

## 2022-02-13 DIAGNOSIS — Z6832 Body mass index (BMI) 32.0-32.9, adult: Secondary | ICD-10-CM

## 2022-02-13 DIAGNOSIS — F900 Attention-deficit hyperactivity disorder, predominantly inattentive type: Secondary | ICD-10-CM

## 2022-02-13 MED ORDER — AMPHETAMINE-DEXTROAMPHETAMINE 20 MG PO TABS: 20.0000 mg | ORAL_TABLET | Freq: Two times a day (BID) | ORAL | 0 refills | Status: DC

## 2022-02-13 MED ORDER — AMPHETAMINE-DEXTROAMPHETAMINE 20 MG PO TABS: 20.0000 mg | ORAL_TABLET | Freq: Every day | ORAL | 0 refills | Status: DC

## 2022-02-13 NOTE — Progress Notes (Signed)
Established Patient Office Visit  Subjective   Patient ID: Crystal Bernard, female    DOB: Nov 20, 1990  Age: 31 y.o. MRN: [id number]  Chief Complaint  Patient presents with   Follow-up   ADHD    HPI Pt is a 31 yo female with ADHD who presents to the [facility] for follow up.   She is doing ok on adderall. She is having to take both of her 10mg  tablets in the morning to get her through her hardest part of the day then she has nothing for afternoon. No side effects. She is sleeping well. Mood is good. She is working on weight loss. Insurance denied wegovy but approved mounjaro. Will start soon.   .. Active Ambulatory Problems    Diagnosis Date Noted   Attention deficit hyperactivity disorder (ADHD), predominantly inattentive type 03/04/2016   Right knee injury 05/03/2016   Class 1 obesity due to excess calories without serious comorbidity with body mass index (BMI) of 32.0 to 32.9 in adult 03/21/2017   Skin lesion of breast 09/14/2017   Supervision of normal first pregnancy 01/26/2018   Group B streptococcal bacteriuria 02/16/2018   Low back strain, initial encounter 12/02/2018   Encounter for initial prescription of contraceptive pills 06/20/2020   Encounter for surveillance of vaginal ring hormonal contraceptive device 01/09/2021   Adjustment disorder with anxiety 07/02/2021   Acute bilateral low back pain without sciatica 07/03/2021   Postpartum anxiety 07/28/2021   Cervical cancer screening 09/18/2021   Encounter for IUD insertion 09/18/2021   Resolved Ambulatory Problems    Diagnosis Date Noted   Abnormal weight gain 03/21/2017   Secondary amenorrhea 11/19/2017   Low serum follicle stimulating hormone Crystal Bernard) 11/19/2017   Past Medical History:  Diagnosis Date   Amenorrhea        Review of Systems  All other systems reviewed and are negative.     Objective:     BP 104/63   Pulse 75   Ht 5' 5.5" (1.664 m)   Wt 196 lb (88.9 kg)   SpO2 100%   BMI 32.12 kg/m   BP Readings from Last 3 Encounters:  02/13/22 104/63  09/18/21 116/79  03/28/21 124/74   Wt Readings from Last 3 Encounters:  02/13/22 196 lb (88.9 kg)  09/18/21 203 lb (92.1 kg)  07/03/21 210 lb (95.3 kg)      Physical Exam Constitutional:      Appearance: Normal appearance. She is obese.  Cardiovascular:     Rate and Rhythm: Normal rate and regular rhythm.  Pulmonary:     Effort: Pulmonary effort is normal.  Neurological:     General: No focal deficit present.     Mental Status: She is alert.  Psychiatric:        Mood and Affect: Mood normal.         Assessment & Plan:  03/23/23Marland KitchenShelby-Anne was seen today for follow-up and adhd.  Diagnoses and all orders for this visit:  Attention deficit hyperactivity disorder (ADHD), predominantly inattentive type -     amphetamine-dextroamphetamine (ADDERALL) 20 MG tablet; Take 1 tablet (20 mg total) by mouth 2 (two) times daily. -     amphetamine-dextroamphetamine (ADDERALL) 20 MG tablet; Take 1 tablet (20 mg total) by mouth daily. -     amphetamine-dextroamphetamine (ADDERALL) 20 MG tablet; Take 1 tablet (20 mg total) by mouth daily.  Class 1 obesity due to excess calories without serious comorbidity with body mass index (BMI) of 32.0 to 32.9 in adult  Increased adderall to 20mg  bid Follow up in 6 months  Will start mounjaro 2.5 Will let me know if wants to increase to 5mg  after first month Continue with diet and exercise    Iran Planas, PA-C

## 2022-02-21 ENCOUNTER — Ambulatory Visit: Payer: BC Managed Care – PPO | Admitting: Professional

## 2022-03-04 NOTE — Telephone Encounter (Signed)
Pt twisted ankle and passed out on 11/20 in the am. Lateral left ankle is swollen and painful to touch and bear weight.

## 2022-03-05 ENCOUNTER — Ambulatory Visit: Payer: BC Managed Care – PPO | Admitting: Family Medicine

## 2022-03-05 ENCOUNTER — Ambulatory Visit (INDEPENDENT_AMBULATORY_CARE_PROVIDER_SITE_OTHER): Payer: BC Managed Care – PPO

## 2022-03-05 DIAGNOSIS — M25572 Pain in left ankle and joints of left foot: Secondary | ICD-10-CM

## 2022-03-05 DIAGNOSIS — S99912A Unspecified injury of left ankle, initial encounter: Secondary | ICD-10-CM | POA: Diagnosis not present

## 2022-03-05 NOTE — Progress Notes (Signed)
Soft tissue swelling but no acute fracture.

## 2022-03-06 ENCOUNTER — Ambulatory Visit: Payer: BC Managed Care – PPO | Admitting: Physician Assistant

## 2022-03-11 ENCOUNTER — Ambulatory Visit: Payer: BC Managed Care – PPO | Admitting: Sports Medicine

## 2022-03-14 ENCOUNTER — Ambulatory Visit: Payer: BC Managed Care – PPO | Admitting: Professional

## 2022-03-27 ENCOUNTER — Other Ambulatory Visit: Payer: Self-pay | Admitting: Physician Assistant

## 2022-03-27 MED ORDER — MOUNJARO 2.5 MG/0.5ML ~~LOC~~ SOAJ: 2.5000 mg | SUBCUTANEOUS | 0 refills | Status: DC

## 2022-03-27 MED ORDER — TIRZEPATIDE 5 MG/0.5ML ~~LOC~~ SOAJ: 5.0000 mg | SUBCUTANEOUS | 0 refills | Status: DC

## 2022-03-27 NOTE — Telephone Encounter (Signed)
L.O.V: 02/13/22  N.O.V: Not scheduled   L.R.F: 01/29/22 Mounjaro 2.5 mg/0.5 ml 66ml 0 refill

## 2022-03-27 NOTE — Addendum Note (Signed)
Addended by: Jomarie Longs on: 03/27/2022 03:03 PM   Modules accepted: Orders

## 2022-03-28 ENCOUNTER — Encounter: Payer: Self-pay | Admitting: Physician Assistant

## 2022-04-04 ENCOUNTER — Ambulatory Visit: Payer: BC Managed Care – PPO | Admitting: Professional

## 2022-04-12 ENCOUNTER — Ambulatory Visit: Payer: BC Managed Care – PPO | Admitting: Sports Medicine

## 2022-04-25 ENCOUNTER — Ambulatory Visit: Payer: BC Managed Care – PPO | Admitting: Professional

## 2022-05-09 DIAGNOSIS — Z1152 Encounter for screening for COVID-19: Secondary | ICD-10-CM | POA: Diagnosis not present

## 2022-05-09 DIAGNOSIS — J069 Acute upper respiratory infection, unspecified: Secondary | ICD-10-CM | POA: Diagnosis not present

## 2022-05-10 ENCOUNTER — Telehealth: Payer: Self-pay | Admitting: Physician Assistant

## 2022-05-10 DIAGNOSIS — J040 Acute laryngitis: Secondary | ICD-10-CM | POA: Insufficient documentation

## 2022-05-10 DIAGNOSIS — J40 Bronchitis, not specified as acute or chronic: Secondary | ICD-10-CM | POA: Insufficient documentation

## 2022-05-10 MED ORDER — PROMETHAZINE-DM 6.25-15 MG/5ML PO SYRP: 5.0000 mL | ORAL_SOLUTION | Freq: Four times a day (QID) | ORAL | 0 refills | Status: DC | PRN

## 2022-05-10 MED ORDER — PREDNISONE 20 MG PO TABS: 40.0000 mg | ORAL_TABLET | Freq: Every day | ORAL | 0 refills | Status: DC

## 2022-05-10 NOTE — Telephone Encounter (Signed)
Saw by PA at work dx with bronchitis. Tested negative for covid/flu/rsv. Nothing was sent. Pt has lost voice. I will send prednisone and cough syrup. Follow up as needed if symptoms worsen or persist.

## 2022-05-16 ENCOUNTER — Ambulatory Visit: Payer: BC Managed Care – PPO | Admitting: Professional

## 2022-05-16 ENCOUNTER — Other Ambulatory Visit: Payer: Self-pay | Admitting: Neurology

## 2022-05-16 MED ORDER — TIRZEPATIDE 7.5 MG/0.5ML ~~LOC~~ SOAJ: 7.5000 mg | SUBCUTANEOUS | 0 refills | Status: DC

## 2022-05-24 NOTE — Telephone Encounter (Signed)
error 

## 2022-06-24 ENCOUNTER — Telehealth: Payer: Self-pay | Admitting: Physician Assistant

## 2022-06-24 MED ORDER — AZITHROMYCIN 250 MG PO TABS: ORAL_TABLET | ORAL | 0 refills | Status: DC

## 2022-06-24 NOTE — Telephone Encounter (Signed)
3-4 weeks of coughing and intermittent sinus pressure and congestion. Blowing out green sputum. No fever, chills, SOB. Treated with zpak for sinobronchitis.

## 2022-07-10 ENCOUNTER — Other Ambulatory Visit: Payer: Self-pay | Admitting: Physician Assistant

## 2022-07-10 DIAGNOSIS — F900 Attention-deficit hyperactivity disorder, predominantly inattentive type: Secondary | ICD-10-CM

## 2022-07-10 MED ORDER — AMPHETAMINE-DEXTROAMPHETAMINE 20 MG PO TABS: 20.0000 mg | ORAL_TABLET | Freq: Every day | ORAL | 0 refills | Status: DC

## 2022-07-10 MED ORDER — AMPHETAMINE-DEXTROAMPHETAMINE 20 MG PO TABS: 20.0000 mg | ORAL_TABLET | Freq: Two times a day (BID) | ORAL | 0 refills | Status: DC

## 2022-07-10 NOTE — Progress Notes (Signed)
Seen 02/2022.  Needs adderall refills.  Doing well. Sent refills.

## 2022-07-29 ENCOUNTER — Encounter: Payer: Self-pay | Admitting: *Deleted

## 2022-08-20 DIAGNOSIS — L309 Dermatitis, unspecified: Secondary | ICD-10-CM | POA: Diagnosis not present

## 2022-08-20 DIAGNOSIS — D229 Melanocytic nevi, unspecified: Secondary | ICD-10-CM | POA: Diagnosis not present

## 2022-09-19 ENCOUNTER — Other Ambulatory Visit: Payer: Self-pay | Admitting: Physician Assistant

## 2022-09-19 DIAGNOSIS — F900 Attention-deficit hyperactivity disorder, predominantly inattentive type: Secondary | ICD-10-CM

## 2022-10-23 ENCOUNTER — Ambulatory Visit: Payer: BC Managed Care – PPO | Admitting: Physician Assistant

## 2022-11-05 ENCOUNTER — Encounter: Payer: Self-pay | Admitting: Physician Assistant

## 2022-11-05 ENCOUNTER — Telehealth: Payer: BC Managed Care – PPO | Admitting: Physician Assistant

## 2022-11-05 VITALS — BP 128/78 | Ht 65.0 in | Wt 197.0 lb

## 2022-11-05 DIAGNOSIS — F900 Attention-deficit hyperactivity disorder, predominantly inattentive type: Secondary | ICD-10-CM

## 2022-11-05 DIAGNOSIS — Z6832 Body mass index (BMI) 32.0-32.9, adult: Secondary | ICD-10-CM

## 2022-11-05 DIAGNOSIS — E6609 Other obesity due to excess calories: Secondary | ICD-10-CM

## 2022-11-05 MED ORDER — AMBULATORY NON FORMULARY MEDICATION: 1 refills | Status: DC

## 2022-11-05 MED ORDER — AMPHETAMINE-DEXTROAMPHETAMINE 20 MG PO TABS
20.0000 mg | ORAL_TABLET | Freq: Two times a day (BID) | ORAL | 0 refills | Status: DC
Start: 2022-12-06 — End: 2023-03-03

## 2022-11-05 MED ORDER — AMBULATORY NON FORMULARY MEDICATION
5 refills | Status: DC
Start: 2022-11-05 — End: 2024-01-13

## 2022-11-05 MED ORDER — AMPHETAMINE-DEXTROAMPHETAMINE 20 MG PO TABS: 20.0000 mg | ORAL_TABLET | Freq: Two times a day (BID) | ORAL | 0 refills | Status: DC

## 2022-11-05 MED ORDER — AMPHETAMINE-DEXTROAMPHETAMINE 20 MG PO TABS
20.0000 mg | ORAL_TABLET | Freq: Two times a day (BID) | ORAL | 0 refills | Status: DC
Start: 2022-11-05 — End: 2023-03-03

## 2022-11-05 NOTE — Progress Notes (Signed)
..Virtual Visit via Video Note  I connected with Crystal Bernard on 11/05/22 at 10:30 AM EDT by a video enabled telemedicine application and verified that I am speaking with the correct person using two identifiers.  Location: Patient: work Provider: clinic  .Marland KitchenParticipating in visit:  Patient: Crystal Bernard Provider: Tandy Gaw PA-C   I discussed the limitations of evaluation and management by telemedicine and the availability of in person appointments. The patient expressed understanding and agreed to proceed.  History of Present Illness: Pt connects to visit to follow up on ADHD and refills and to discus weight loss. She was on wegovy and doing great. She felt great and losing weight. She had to transition to [place] due to insurance but never liked the [employer]. It made her feel like she had the flu. She has been off everything since march and gained about 5lbs but trying everything to lose. She would like to discuss options. Adderall doing well for focus.   .. Active Ambulatory Problems    Diagnosis Date Noted   Attention deficit hyperactivity disorder (ADHD), predominantly inattentive type 03/04/2016   Right knee injury 05/03/2016   Class 1 obesity due to excess calories without serious comorbidity with body mass index (BMI) of 32.0 to 32.9 in adult 03/21/2017   Skin lesion of breast 09/14/2017   Supervision of normal first pregnancy 01/26/2018   Group B streptococcal bacteriuria 02/16/2018   Low back strain, initial encounter 12/02/2018   Encounter for initial prescription of contraceptive pills 06/20/2020   Encounter for surveillance of vaginal ring hormonal contraceptive device 01/09/2021   Adjustment disorder with anxiety 07/02/2021   Acute bilateral low back pain without sciatica 07/03/2021   Postpartum anxiety 07/28/2021   Cervical cancer screening 09/18/2021   Encounter for IUD insertion 09/18/2021   Laryngitis 05/10/2022   Bronchitis 05/10/2022   Resolved  Ambulatory Problems    Diagnosis Date Noted   Abnormal weight gain 03/21/2017   Secondary amenorrhea 11/19/2017   Low serum follicle stimulating hormone (FSH) 11/19/2017   Past Medical History:  Diagnosis Date   Amenorrhea        Observations/Objective: No acute distress Normal mood and appearance  .Marland Kitchen Today's Vitals   11/05/22 1034  BP: 128/78  Weight: 197 lb (89.4 kg)  Height: 5\' 5"  (1.651 m)   Body mass index is 32.78 kg/m.    Assessment and Plan: Marland KitchenMarland KitchenShelby-Anne was seen today for medical management of chronic issues.  Diagnoses and all orders for this visit:  Attention deficit hyperactivity disorder (ADHD), predominantly inattentive type -     amphetamine-dextroamphetamine (ADDERALL) 20 MG tablet; Take 1 tablet (20 mg total) by mouth 2 (two) times daily. -     amphetamine-dextroamphetamine (ADDERALL) 20 MG tablet; Take 1 tablet (20 mg total) by mouth 2 (two) times daily. -     amphetamine-dextroamphetamine (ADDERALL) 20 MG tablet; Take 1 tablet (20 mg total) by mouth 2 (two) times daily.  Class 1 obesity due to excess calories without serious comorbidity with body mass index (BMI) of 32.0 to 32.9 in adult -     AMBULATORY NON FORMULARY MEDICATION; Semaglutide 2.5mg  with pyridoxine 10mg  per mL Inject 96 units per week.  Other orders -     Discontinue: AMBULATORY NON FORMULARY MEDICATION; Semaglutide 2.5mg  with pyridoxine 10mg  per mL Inject 96 units per week.    Refilled Adderall 20mg  bid Follow up in 6 months  .Marland KitchenDiscussed low carb diet with 1500 calories and 80g of protein.  Exercising at least 150 minutes a week.  [employer] could be a [employer].  [facility]  Start 10 units weekly for one month 20 units weekly for one month 40 units weekly for one month 68 units weekly for one month 96 units weekly.    Follow Up Instructions:    I discussed the assessment and treatment plan with the patient. The patient was provided an  opportunity to ask questions and all were answered. The patient agreed with the plan and demonstrated an understanding of the instructions.   The patient was advised to call back or seek an in-person evaluation if the symptoms worsen or if the condition fails to improve as anticipated.   Tandy Gaw, PA-C

## 2022-12-09 ENCOUNTER — Other Ambulatory Visit: Payer: Self-pay | Admitting: Physician Assistant

## 2022-12-09 DIAGNOSIS — M545 Low back pain, unspecified: Secondary | ICD-10-CM

## 2022-12-09 MED ORDER — IBUPROFEN 800 MG PO TABS: 800.0000 mg | ORAL_TABLET | Freq: Three times a day (TID) | ORAL | 0 refills | Status: AC | PRN

## 2022-12-09 MED ORDER — CYCLOBENZAPRINE HCL 10 MG PO TABS: 10.0000 mg | ORAL_TABLET | Freq: Three times a day (TID) | ORAL | 1 refills | Status: AC | PRN

## 2022-12-09 NOTE — Progress Notes (Signed)
Pt calls in with low back pain after picking up her toddler this weekend and feeling a pull and crunchy sensation in lower back. No radiation or pain. Not tried anything to make better.  Sending muscle relaxer and ibuprofen to pharmacy  Use icy hot patches Follow up if not improving or symptoms worsening.

## 2022-12-20 ENCOUNTER — Encounter: Payer: Self-pay | Admitting: Physician Assistant

## 2022-12-20 ENCOUNTER — Ambulatory Visit: Payer: BC Managed Care – PPO | Admitting: Physician Assistant

## 2022-12-20 VITALS — BP 101/66 | HR 105 | Ht 65.0 in | Wt 210.0 lb

## 2022-12-20 DIAGNOSIS — M5136 Other intervertebral disc degeneration, lumbar region: Secondary | ICD-10-CM | POA: Diagnosis not present

## 2022-12-20 MED ORDER — PREDNISONE 50 MG PO TABS
ORAL_TABLET | ORAL | 0 refills | Status: DC
Start: 2022-12-20 — End: 2023-03-03

## 2022-12-20 MED ORDER — KETOROLAC TROMETHAMINE 60 MG/2ML IM SOLN
60.0000 mg | Freq: Once | INTRAMUSCULAR | Status: AC
Start: 2022-12-20 — End: 2022-12-20
  Administered 2022-12-20: 60 mg via INTRAMUSCULAR

## 2022-12-20 MED ORDER — TRAMADOL HCL 50 MG PO TABS
50.0000 mg | ORAL_TABLET | Freq: Three times a day (TID) | ORAL | 0 refills | Status: AC | PRN
Start: 2022-12-20 — End: 2022-12-25

## 2022-12-20 NOTE — Progress Notes (Signed)
Acute Office Visit  Subjective:     Patient ID: Crystal Bernard, female    DOB: 17-Sep-1990, 32 y.o.   MRN: [id number]  Chief Complaint  Patient presents with   Back Pain    HPI Patient is in today for discogenic back pain. 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 Patient reports resting, taking a muscle relaxer, and using some heat helped to alleviate her symptoms. Patient reports an increase of pain on 12/18/22 after cleaning in a position of sustained flexion of the back. Patient states that her pain is mostly in her lower back, worse on the right side. She reports pain that shoots down into her bilateral thighs, pain when moving to sit down and rising from a seated position, pain with ADLs, and pain with walking. Pain is a 4/10 today after taking Flexeril. Pain is also alleviated with heat and rest. Patient additionally reports some weakness in her bilateral upper extremities that is worse in her right arm. She denies any numbness, tingling, or paraesthesias in her bilateral lower extremities and denies any red flag symptoms of saddle anesthesia or bowel/bladder dysfunction.  .. Active Ambulatory Problems    Diagnosis Date Noted   Attention deficit hyperactivity disorder (ADHD), predominantly inattentive type 03/04/2016   Right knee injury 05/03/2016   Class 1 obesity due to excess calories without serious comorbidity with body mass index (BMI) of 32.0 to 32.9 in adult 03/21/2017   Skin lesion of breast 09/14/2017   Supervision of normal first pregnancy 01/26/2018   Group B streptococcal bacteriuria 02/16/2018   Low back strain, initial encounter 12/02/2018   Encounter for initial prescription of contraceptive pills 06/20/2020   Encounter for surveillance of vaginal ring hormonal contraceptive device 01/09/2021   Adjustment disorder with anxiety 07/02/2021   Acute bilateral low back pain without sciatica  07/03/2021   Postpartum anxiety 07/28/2021   Cervical cancer screening 09/18/2021   Encounter for IUD insertion 09/18/2021   Laryngitis 05/10/2022   Bronchitis 05/10/2022   Discogenic low back pain 12/20/2022   Resolved Ambulatory Problems    Diagnosis Date Noted   Abnormal weight gain 03/21/2017   Secondary amenorrhea 11/19/2017   Low serum follicle stimulating hormone (FSH) 11/19/2017   Past Medical History:  Diagnosis Date   Amenorrhea      ROS See HPI.      Objective:    BP 101/66   Pulse (!) 105   Ht 5\' 5"  (1.651 m)   Wt 210 lb (95.3 kg)   SpO2 100%   BMI 34.95 kg/m  BP Readings from Last 3 Encounters:  12/20/22 101/66  11/05/22 128/78  02/13/22 104/63   Wt Readings from Last 3 Encounters:  12/20/22 210 lb (95.3 kg)  11/05/22 197 lb (89.4 kg)  02/13/22 196 lb (88.9 kg)      Physical Exam  On physical exam, patient has significant pain to palpation over the lower back at L4-L5. Strength in bilateral UE/LE was 5/5. Bilateral patellar DTRs are 2+ and equal. A modified straight leg raise elicits pain in bilateral lower extremities.      Assessment & Plan:  Marland KitchenMarland KitchenShelby-Anne was seen today for back pain.  Diagnoses and all orders for this visit:  Discogenic low back pain -     predniSONE (DELTASONE) 50 MG tablet; One tab PO daily for 5 days. -     traMADol (ULTRAM) 50 MG tablet; Take 1 tablet (50  mg total) by mouth every 8 (eight) hours as needed for up to 5 days. -     DG Lumbar Spine Complete; Future -     ketorolac (TORADOL) injection 60 mg   VS stable, increased HR due to just taking adderall which pt endorses happens from time to time  No red red flags on todays exam Likely discogenic pain Get lumbar xray Toradol 60mg  IM given today Start prednisone burst tomorrow Continue use of flexeril, icy hot patches Start using good lifting mechanics Discussed exercises to work on low back and core strength HO given Follow up if not improving or symptoms  worsening Tramadol given to use for break through pain .Marland KitchenPDMP reviewed during this encounter. No concerns.    Return if symptoms worsen or fail to improve.

## 2022-12-20 NOTE — Patient Instructions (Signed)
Herniated Disk  A herniated disk happens when a disk in the spine bulges out too far. There is an oval disk between each pair of bones (vertebrae) in the backbone or spine. The disks connect the bones, help the spine move, and keep the bones from rubbing against each other when you move. A herniated disk can happen anywhere in the back or neck area. It most often affects the lower back. What are the causes? This condition may be caused by: Wear and tear as you age. Sudden injury, such as a strain or sprain. What increases the risk? The following factors may make you more likely to develop this condition: Age. The older you are the higher the risk. Being a man who is 22-72 years old. Doing activities that involve heavy lifting, bending, or twisting. Not getting enough exercise. Being overweight. Smoking or using tobacco. What are the signs or symptoms? Symptoms may vary depending on where the herniated disk is in your body. Sharp pain in the arm, hip, butt, or in the lower back. The pain can spread to the leg and foot. Dizziness. A feeling that things are moving when they are not (vertigo). Pain or weakness in the neck, shoulder, upper or lower arm, or fingers. Muscle weakness. You may not be able to: Lift your arm or leg. Stand on your toes. Squeeze with your hands. Loss of feeling (numbness) or tingling in the hands, arms, feet, or legs. Being unable to control when to poop or pee. This is rare but serious. How is this treated? This condition may be treated with: Resting for a few days or several weeks. Do not do things that need a lot of effort. Do not go into complete bed rest. Do only light activities. If you have a herniated disk in your lower back, limit how much you sit. Sitting puts more pressure on the disk. Medicines for pain, swelling, or tense muscles. Ice or heat therapy. Steroid shots. These can reduce pain and swelling. Physical therapy to strengthen your  back. Follow these instructions at home: Medicines Take over-the-counter and prescription medicines only as told by your doctor. If told, take steps to prevent problems with pooping (constipation). You may need to: Drink enough fluid to keep your pee (urine) pale yellow. Take over-the-counter or prescription medicines. Eat foods that are high in fiber. These include beans, whole grains, and fresh fruits and vegetables. Limit foods that are high in fat and processed sugars. These include fried or sweet foods. Ask your doctor if you should avoid driving or using machines while you are taking your medicines. Managing pain, stiffness, and swelling     If told, put ice on the affected area. To do this: Put ice in a plastic bag. Place a towel between your skin and the bag. Leave the ice on for 20 minutes, 2-3 times a day. If told, put heat on the painful area. Use the heat source that your doctor recommends, such as a moist heat pack or a heating pad. Place a towel between your skin and the heat source. Leave the heat on for 20-30 minutes. Take off the heat or ice if your skin turns bright red. If you cannot feel pain, heat, or cold, you have a greater risk of getting burned. Activity Rest as told by your doctor. Avoid strict bed rest. Do only activities that do not cause pain. After your rest period: Return to your normal activities as told by your doctor. Slowly start doing exercises as  told. Ask your doctor what activities and exercises are safe for you. Use good posture. Avoid movements that cause pain. Do not lift anything that is heavier than 10 lb (4.5 kg), or the limit that you are told. Do not sit or stand for a long time without moving. Do not sit for a long time without getting up and moving around. If exercises (physical therapy) were prescribed, do them as told by your doctor. Try to strengthen your back and belly with exercises such as swimming or walking. General  instructions Do not smoke or use any products that contain nicotine or tobacco. If you need help quitting, ask your doctor. Do not wear high-heeled shoes. Do not sleep on your belly. If you are overweight, work with your doctor to lose weight safely. Keep all follow-up visits. How is this prevented? Stay at a healthy weight. Stay in shape. Do at least 150 minutes of moderate-intensity exercise each week, such as fast walking or water aerobics. When lifting objects: Keep your feet as far apart as your shoulders or farther apart. Tighten your belly muscles. Bend your knees and hips, and keep your spine neutral. Lift using the strength of your legs, not your back. Do not lock your knees straight out. Always ask for help to lift heavy or large objects. Contact a doctor if: You have back pain or neck pain that does not get better after 6 weeks. You have very bad pain in your back, neck, legs, or arms. You get any of these problems in any part of your body: Tingling. Weakness. Loss of feeling. Get help right away if: You cannot move your arms or legs. You cannot control when you pee or poop. You feel dizzy. You faint. You have trouble breathing. These symptoms may be an emergency. Get help right away. Call your local emergency services (911 in the [place] Do not wait to see if the symptoms will go away. Do not drive yourself to the hospital. Summary A herniated disk happens when a disk in your backbone bulges out too far. This condition may be caused by wear and tear as you age or a sudden injury. Symptoms may vary depending on where your herniated disk is in your body. Treatment may include rest, medicines, ice or heat therapy, steroid shots, and exercises. This information is not intended to replace advice given to you by your health care provider. Make sure you discuss any questions you have with your health care provider. Document Revised: 07/21/2019 Document Reviewed:  07/21/2019 Elsevier Patient Education  2024 [employer].

## 2023-01-14 DIAGNOSIS — H5213 Myopia, bilateral: Secondary | ICD-10-CM | POA: Diagnosis not present

## 2023-01-29 DIAGNOSIS — E6609 Other obesity due to excess calories: Secondary | ICD-10-CM | POA: Diagnosis not present

## 2023-01-29 DIAGNOSIS — Z1339 Encounter for screening examination for other mental health and behavioral disorders: Secondary | ICD-10-CM | POA: Diagnosis not present

## 2023-01-29 DIAGNOSIS — Z719 Counseling, unspecified: Secondary | ICD-10-CM | POA: Diagnosis not present

## 2023-03-03 ENCOUNTER — Other Ambulatory Visit: Payer: Self-pay | Admitting: Physician Assistant

## 2023-03-03 DIAGNOSIS — F900 Attention-deficit hyperactivity disorder, predominantly inattentive type: Secondary | ICD-10-CM

## 2023-03-03 MED ORDER — AMPHETAMINE-DEXTROAMPHETAMINE 20 MG PO TABS: 20.0000 mg | ORAL_TABLET | Freq: Two times a day (BID) | ORAL | 0 refills | Status: DC

## 2023-03-03 MED ORDER — AMPHETAMINE-DEXTROAMPHETAMINE 20 MG PO TABS
20.0000 mg | ORAL_TABLET | Freq: Two times a day (BID) | ORAL | 0 refills | Status: DC
Start: 2023-04-02 — End: 2023-05-30

## 2023-03-03 MED ORDER — AMPHETAMINE-DEXTROAMPHETAMINE 20 MG PO TABS
20.0000 mg | ORAL_TABLET | Freq: Two times a day (BID) | ORAL | 0 refills | Status: DC
Start: 2023-03-03 — End: 2023-05-30

## 2023-03-03 NOTE — Progress Notes (Signed)
No concerns.  Refilled adderall for 3 months Follow up in 3 months.

## 2023-05-26 ENCOUNTER — Other Ambulatory Visit: Payer: Self-pay | Admitting: Physician Assistant

## 2023-05-26 DIAGNOSIS — J101 Influenza due to other identified influenza virus with other respiratory manifestations: Secondary | ICD-10-CM

## 2023-05-26 MED ORDER — OSELTAMIVIR PHOSPHATE 75 MG PO CAPS: 75.0000 mg | ORAL_CAPSULE | Freq: Two times a day (BID) | ORAL | 0 refills | Status: DC

## 2023-05-30 ENCOUNTER — Other Ambulatory Visit: Payer: Self-pay | Admitting: Physician Assistant

## 2023-05-30 DIAGNOSIS — F900 Attention-deficit hyperactivity disorder, predominantly inattentive type: Secondary | ICD-10-CM

## 2023-05-30 MED ORDER — AMPHETAMINE-DEXTROAMPHETAMINE 20 MG PO TABS
20.0000 mg | ORAL_TABLET | Freq: Two times a day (BID) | ORAL | 0 refills | Status: DC
Start: 2023-05-30 — End: 2024-01-13

## 2023-05-30 MED ORDER — AMPHETAMINE-DEXTROAMPHETAMINE 20 MG PO TABS: 20.0000 mg | ORAL_TABLET | Freq: Two times a day (BID) | ORAL | 0 refills | Status: DC

## 2023-05-30 MED ORDER — AMPHETAMINE-DEXTROAMPHETAMINE 20 MG PO TABS
20.0000 mg | ORAL_TABLET | Freq: Two times a day (BID) | ORAL | 0 refills | Status: DC
Start: 2023-07-29 — End: 2024-01-13

## 2023-11-17 ENCOUNTER — Other Ambulatory Visit: Payer: Self-pay | Admitting: Physician Assistant

## 2023-11-17 DIAGNOSIS — Z20822 Contact with and (suspected) exposure to covid-19: Secondary | ICD-10-CM

## 2023-11-17 MED ORDER — NIRMATRELVIR/RITONAVIR (PAXLOVID)TABLET: 3.0000 | ORAL_TABLET | Freq: Two times a day (BID) | ORAL | 0 refills | Status: AC

## 2023-11-17 NOTE — Progress Notes (Signed)
 Mother has covid and she lives with her. She has multiple covid 19 symptoms. Start paxlovid . Quarantine for 5 days or until symptoms improving.

## 2023-12-16 ENCOUNTER — Encounter: Payer: Self-pay | Admitting: Sports Medicine

## 2024-01-09 ENCOUNTER — Encounter: Payer: Self-pay | Admitting: Physician Assistant

## 2024-01-09 ENCOUNTER — Other Ambulatory Visit: Payer: Self-pay | Admitting: Medical Genetics

## 2024-01-13 ENCOUNTER — Other Ambulatory Visit: Payer: Self-pay | Admitting: Physician Assistant

## 2024-01-13 ENCOUNTER — Ambulatory Visit: Admitting: Physician Assistant

## 2024-01-13 DIAGNOSIS — F900 Attention-deficit hyperactivity disorder, predominantly inattentive type: Secondary | ICD-10-CM

## 2024-01-13 MED ORDER — AMPHETAMINE-DEXTROAMPHETAMINE 20 MG PO TABS: 20.0000 mg | ORAL_TABLET | Freq: Two times a day (BID) | ORAL | 0 refills | Status: AC

## 2024-01-13 NOTE — Progress Notes (Signed)
..  PDMP reviewed during this encounter. No concerns.  Adderall refilled.  Appt in janurary When she gets insurance.

## 2024-01-23 NOTE — Telephone Encounter (Signed)
 Attempted to follow-up with patient regarding previous encounter. Patient is in need of an annual exam/ office visit for further refills. Unfortunately I was sent to voicemail. Per DPR, I have left a detailed voice message requesting a call back to our office to schedule an appointment.

## 2024-03-23 ENCOUNTER — Other Ambulatory Visit: Payer: Self-pay | Admitting: Physician Assistant

## 2024-03-23 MED ORDER — AMOXICILLIN 875 MG PO TABS: 875.0000 mg | ORAL_TABLET | Freq: Two times a day (BID) | ORAL | 0 refills | Status: AC

## 2024-03-23 MED ORDER — PROMETHAZINE-DM 6.25-15 MG/5ML PO SYRP: 5.0000 mL | ORAL_SOLUTION | Freq: Four times a day (QID) | ORAL | 0 refills | Status: AC | PRN

## 2024-03-23 NOTE — Progress Notes (Signed)
 Pt lost her father 2 days ago and has been battleing URI symptoms for the last 5 days that are worsening. She has no voice. She has low grade fever. She is requesting medication to help with symptoms. Failed OtC cold and sinus medications.   Augmentin  and cough syrup sent to pharmacy. Follow up as needed or if symptoms worsen.
